# Patient Record
Sex: Female | Born: 1955 | Race: Black or African American | Hispanic: No | Marital: Single
Health system: Southern US, Community
[De-identification: ages and names within clinical notes are randomized; demographics above are authoritative.]

## PROBLEM LIST (undated history)

## (undated) DIAGNOSIS — J449 Chronic obstructive pulmonary disease, unspecified: Secondary | ICD-10-CM

## (undated) DIAGNOSIS — H409 Unspecified glaucoma: Secondary | ICD-10-CM

## (undated) DIAGNOSIS — K219 Gastro-esophageal reflux disease without esophagitis: Secondary | ICD-10-CM

## (undated) DIAGNOSIS — G4733 Obstructive sleep apnea (adult) (pediatric): Secondary | ICD-10-CM

## (undated) DIAGNOSIS — I5030 Unspecified diastolic (congestive) heart failure: Secondary | ICD-10-CM

## (undated) DIAGNOSIS — I471 Supraventricular tachycardia, unspecified: Secondary | ICD-10-CM

## (undated) DIAGNOSIS — E669 Obesity, unspecified: Secondary | ICD-10-CM

## (undated) DIAGNOSIS — I480 Paroxysmal atrial fibrillation: Secondary | ICD-10-CM

## (undated) DIAGNOSIS — N950 Postmenopausal bleeding: Secondary | ICD-10-CM

## (undated) DIAGNOSIS — J9611 Chronic respiratory failure with hypoxia: Secondary | ICD-10-CM

## (undated) DIAGNOSIS — N183 Chronic kidney disease, stage 3 unspecified: Secondary | ICD-10-CM

## (undated) DIAGNOSIS — I272 Pulmonary hypertension, unspecified: Secondary | ICD-10-CM

## (undated) DIAGNOSIS — I10 Essential (primary) hypertension: Secondary | ICD-10-CM

## (undated) DIAGNOSIS — F32A Depression, unspecified: Secondary | ICD-10-CM

## (undated) DIAGNOSIS — R569 Unspecified convulsions: Secondary | ICD-10-CM

## (undated) DIAGNOSIS — D509 Iron deficiency anemia, unspecified: Secondary | ICD-10-CM

## (undated) DIAGNOSIS — M549 Dorsalgia, unspecified: Secondary | ICD-10-CM

## (undated) DIAGNOSIS — E1169 Type 2 diabetes mellitus with other specified complication: Secondary | ICD-10-CM

---

## 2019-11-19 ENCOUNTER — Emergency Department: Payer: Medicare HMO

## 2019-11-19 ENCOUNTER — Inpatient Hospital Stay
Admission: EM | Admit: 2019-11-19 | Discharge: 2019-12-12 | DRG: 871 | Disposition: E | Payer: Medicare HMO | Attending: Internal Medicine | Admitting: Internal Medicine

## 2019-11-19 DIAGNOSIS — R0902 Hypoxemia: Secondary | ICD-10-CM

## 2019-11-19 DIAGNOSIS — Z9981 Dependence on supplemental oxygen: Secondary | ICD-10-CM

## 2019-11-19 DIAGNOSIS — I509 Heart failure, unspecified: Secondary | ICD-10-CM

## 2019-11-19 DIAGNOSIS — N289 Disorder of kidney and ureter, unspecified: Secondary | ICD-10-CM

## 2019-11-19 DIAGNOSIS — I4892 Unspecified atrial flutter: Secondary | ICD-10-CM | POA: Diagnosis present

## 2019-11-19 DIAGNOSIS — Z66 Do not resuscitate: Secondary | ICD-10-CM | POA: Diagnosis not present

## 2019-11-19 DIAGNOSIS — Z20822 Contact with and (suspected) exposure to covid-19: Secondary | ICD-10-CM | POA: Diagnosis present

## 2019-11-19 DIAGNOSIS — I5032 Chronic diastolic (congestive) heart failure: Secondary | ICD-10-CM | POA: Diagnosis not present

## 2019-11-19 DIAGNOSIS — N170 Acute kidney failure with tubular necrosis: Secondary | ICD-10-CM | POA: Diagnosis present

## 2019-11-19 DIAGNOSIS — I480 Paroxysmal atrial fibrillation: Secondary | ICD-10-CM | POA: Diagnosis not present

## 2019-11-19 DIAGNOSIS — I493 Ventricular premature depolarization: Secondary | ICD-10-CM | POA: Diagnosis not present

## 2019-11-19 DIAGNOSIS — N184 Chronic kidney disease, stage 4 (severe): Secondary | ICD-10-CM | POA: Diagnosis present

## 2019-11-19 DIAGNOSIS — E11649 Type 2 diabetes mellitus with hypoglycemia without coma: Secondary | ICD-10-CM | POA: Diagnosis not present

## 2019-11-19 DIAGNOSIS — E861 Hypovolemia: Secondary | ICD-10-CM | POA: Diagnosis present

## 2019-11-19 DIAGNOSIS — J9621 Acute and chronic respiratory failure with hypoxia: Secondary | ICD-10-CM | POA: Diagnosis present

## 2019-11-19 DIAGNOSIS — R6521 Severe sepsis with septic shock: Secondary | ICD-10-CM | POA: Diagnosis present

## 2019-11-19 DIAGNOSIS — G92 Toxic encephalopathy: Secondary | ICD-10-CM | POA: Diagnosis not present

## 2019-11-19 DIAGNOSIS — Z83511 Family history of glaucoma: Secondary | ICD-10-CM

## 2019-11-19 DIAGNOSIS — E875 Hyperkalemia: Secondary | ICD-10-CM | POA: Diagnosis present

## 2019-11-19 DIAGNOSIS — E785 Hyperlipidemia, unspecified: Secondary | ICD-10-CM | POA: Diagnosis present

## 2019-11-19 DIAGNOSIS — R569 Unspecified convulsions: Secondary | ICD-10-CM | POA: Diagnosis not present

## 2019-11-19 DIAGNOSIS — I50813 Acute on chronic right heart failure: Secondary | ICD-10-CM | POA: Diagnosis not present

## 2019-11-19 DIAGNOSIS — I440 Atrioventricular block, first degree: Secondary | ICD-10-CM | POA: Diagnosis present

## 2019-11-19 DIAGNOSIS — I2729 Other secondary pulmonary hypertension: Secondary | ICD-10-CM | POA: Diagnosis present

## 2019-11-19 DIAGNOSIS — I69354 Hemiplegia and hemiparesis following cerebral infarction affecting left non-dominant side: Secondary | ICD-10-CM

## 2019-11-19 DIAGNOSIS — R4189 Other symptoms and signs involving cognitive functions and awareness: Secondary | ICD-10-CM | POA: Diagnosis present

## 2019-11-19 DIAGNOSIS — T502X5A Adverse effect of carbonic-anhydrase inhibitors, benzothiadiazides and other diuretics, initial encounter: Secondary | ICD-10-CM | POA: Diagnosis present

## 2019-11-19 DIAGNOSIS — E872 Acidosis: Secondary | ICD-10-CM | POA: Diagnosis not present

## 2019-11-19 DIAGNOSIS — Z880 Allergy status to penicillin: Secondary | ICD-10-CM

## 2019-11-19 DIAGNOSIS — I639 Cerebral infarction, unspecified: Secondary | ICD-10-CM

## 2019-11-19 DIAGNOSIS — I248 Other forms of acute ischemic heart disease: Secondary | ICD-10-CM | POA: Diagnosis present

## 2019-11-19 DIAGNOSIS — Z515 Encounter for palliative care: Secondary | ICD-10-CM | POA: Diagnosis not present

## 2019-11-19 DIAGNOSIS — I5021 Acute systolic (congestive) heart failure: Secondary | ICD-10-CM | POA: Diagnosis not present

## 2019-11-19 DIAGNOSIS — N189 Chronic kidney disease, unspecified: Secondary | ICD-10-CM | POA: Diagnosis not present

## 2019-11-19 DIAGNOSIS — I469 Cardiac arrest, cause unspecified: Secondary | ICD-10-CM | POA: Diagnosis not present

## 2019-11-19 DIAGNOSIS — I130 Hypertensive heart and chronic kidney disease with heart failure and stage 1 through stage 4 chronic kidney disease, or unspecified chronic kidney disease: Secondary | ICD-10-CM | POA: Diagnosis present

## 2019-11-19 DIAGNOSIS — E662 Morbid (severe) obesity with alveolar hypoventilation: Secondary | ICD-10-CM | POA: Diagnosis present

## 2019-11-19 DIAGNOSIS — K761 Chronic passive congestion of liver: Secondary | ICD-10-CM | POA: Diagnosis present

## 2019-11-19 DIAGNOSIS — N179 Acute kidney failure, unspecified: Secondary | ICD-10-CM | POA: Diagnosis not present

## 2019-11-19 DIAGNOSIS — I5033 Acute on chronic diastolic (congestive) heart failure: Secondary | ICD-10-CM | POA: Diagnosis present

## 2019-11-19 DIAGNOSIS — Z6841 Body Mass Index (BMI) 40.0 and over, adult: Secondary | ICD-10-CM | POA: Diagnosis not present

## 2019-11-19 DIAGNOSIS — Z888 Allergy status to other drugs, medicaments and biological substances status: Secondary | ICD-10-CM

## 2019-11-19 DIAGNOSIS — Z8249 Family history of ischemic heart disease and other diseases of the circulatory system: Secondary | ICD-10-CM

## 2019-11-19 DIAGNOSIS — I959 Hypotension, unspecified: Secondary | ICD-10-CM | POA: Diagnosis present

## 2019-11-19 DIAGNOSIS — J440 Chronic obstructive pulmonary disease with acute lower respiratory infection: Secondary | ICD-10-CM | POA: Diagnosis present

## 2019-11-19 DIAGNOSIS — Z809 Family history of malignant neoplasm, unspecified: Secondary | ICD-10-CM

## 2019-11-19 DIAGNOSIS — E1122 Type 2 diabetes mellitus with diabetic chronic kidney disease: Secondary | ICD-10-CM | POA: Diagnosis present

## 2019-11-19 DIAGNOSIS — K219 Gastro-esophageal reflux disease without esophagitis: Secondary | ICD-10-CM | POA: Diagnosis present

## 2019-11-19 DIAGNOSIS — A419 Sepsis, unspecified organism: Principal | ICD-10-CM | POA: Diagnosis present

## 2019-11-19 DIAGNOSIS — J189 Pneumonia, unspecified organism: Secondary | ICD-10-CM | POA: Diagnosis present

## 2019-11-19 DIAGNOSIS — R7989 Other specified abnormal findings of blood chemistry: Secondary | ICD-10-CM

## 2019-11-19 DIAGNOSIS — I272 Pulmonary hypertension, unspecified: Secondary | ICD-10-CM | POA: Diagnosis not present

## 2019-11-19 DIAGNOSIS — H409 Unspecified glaucoma: Secondary | ICD-10-CM | POA: Diagnosis present

## 2019-11-19 DIAGNOSIS — D631 Anemia in chronic kidney disease: Secondary | ICD-10-CM | POA: Diagnosis present

## 2019-11-19 DIAGNOSIS — J9601 Acute respiratory failure with hypoxia: Secondary | ICD-10-CM | POA: Diagnosis present

## 2019-11-19 DIAGNOSIS — I5082 Biventricular heart failure: Secondary | ICD-10-CM | POA: Diagnosis present

## 2019-11-19 DIAGNOSIS — J9622 Acute and chronic respiratory failure with hypercapnia: Secondary | ICD-10-CM | POA: Diagnosis not present

## 2019-11-19 HISTORY — DX: Iron deficiency anemia, unspecified: D50.9

## 2019-11-19 HISTORY — DX: Chronic obstructive pulmonary disease, unspecified: J44.9

## 2019-11-19 HISTORY — DX: Type 2 diabetes mellitus with other specified complication: E66.9

## 2019-11-19 HISTORY — DX: Obstructive sleep apnea (adult) (pediatric): G47.33

## 2019-11-19 HISTORY — DX: Depression, unspecified: F32.A

## 2019-11-19 HISTORY — DX: Postmenopausal bleeding: N95.0

## 2019-11-19 HISTORY — DX: Gastro-esophageal reflux disease without esophagitis: K21.9

## 2019-11-19 HISTORY — DX: Supraventricular tachycardia: I47.1

## 2019-11-19 HISTORY — DX: Dorsalgia, unspecified: M54.9

## 2019-11-19 HISTORY — DX: Supraventricular tachycardia, unspecified: I47.10

## 2019-11-19 HISTORY — DX: Chronic kidney disease, stage 3 unspecified: N18.30

## 2019-11-19 HISTORY — DX: Chronic respiratory failure with hypoxia: J96.11

## 2019-11-19 HISTORY — DX: Essential (primary) hypertension: I10

## 2019-11-19 HISTORY — DX: Unspecified convulsions: R56.9

## 2019-11-19 HISTORY — DX: Obesity, unspecified: E11.69

## 2019-11-19 HISTORY — DX: Morbid (severe) obesity due to excess calories: E66.01

## 2019-11-19 HISTORY — DX: Unspecified diastolic (congestive) heart failure: I50.30

## 2019-11-19 HISTORY — DX: Paroxysmal atrial fibrillation: I48.0

## 2019-11-19 HISTORY — DX: Pulmonary hypertension, unspecified: I27.20

## 2019-11-19 HISTORY — DX: Unspecified glaucoma: H40.9

## 2019-11-19 LAB — CBC
HCT: 32.5 % — ABNORMAL LOW (ref 36.0–46.0)
Hemoglobin: 9.8 g/dL — ABNORMAL LOW (ref 12.0–15.0)
MCH: 26.5 pg (ref 26.0–34.0)
MCHC: 30.2 g/dL (ref 30.0–36.0)
MCV: 87.8 fL (ref 80.0–100.0)
Platelets: 260 10*3/uL (ref 150–400)
RBC: 3.7 MIL/uL — ABNORMAL LOW (ref 3.87–5.11)
RDW: 18.9 % — ABNORMAL HIGH (ref 11.5–15.5)
WBC: 14 10*3/uL — ABNORMAL HIGH (ref 4.0–10.5)
nRBC: 0 % (ref 0.0–0.2)

## 2019-11-19 LAB — COMPREHENSIVE METABOLIC PANEL
ALT: 29 U/L (ref 0–44)
AST: 55 U/L — ABNORMAL HIGH (ref 15–41)
Albumin: 3.5 g/dL (ref 3.5–5.0)
Alkaline Phosphatase: 291 U/L — ABNORMAL HIGH (ref 38–126)
Anion gap: 29 — ABNORMAL HIGH (ref 5–15)
BUN: 155 mg/dL — ABNORMAL HIGH (ref 8–23)
CO2: 27 mmol/L (ref 22–32)
Calcium: 8.6 mg/dL — ABNORMAL LOW (ref 8.9–10.3)
Chloride: 80 mmol/L — ABNORMAL LOW (ref 98–111)
Creatinine, Ser: 4.79 mg/dL — ABNORMAL HIGH (ref 0.44–1.00)
GFR calc Af Amer: 10 mL/min — ABNORMAL LOW (ref >60)
GFR calc non Af Amer: 9 mL/min — ABNORMAL LOW (ref >60)
Glucose, Bld: 136 mg/dL — ABNORMAL HIGH (ref 70–99)
Potassium: 3.4 mmol/L — ABNORMAL LOW (ref 3.5–5.1)
Sodium: 136 mmol/L (ref 135–145)
Total Bilirubin: 1.5 mg/dL — ABNORMAL HIGH (ref 0.3–1.2)
Total Protein: 8.1 g/dL (ref 6.5–8.1)

## 2019-11-19 LAB — BRAIN NATRIURETIC PEPTIDE: B Natriuretic Peptide: 1712.6 pg/mL — ABNORMAL HIGH (ref 0.0–100.0)

## 2019-11-19 LAB — TROPONIN I (HIGH SENSITIVITY)
Troponin I (High Sensitivity): 82 ng/L — ABNORMAL HIGH (ref <18)
Troponin I (High Sensitivity): 93 ng/L — ABNORMAL HIGH (ref <18)

## 2019-11-19 MED ORDER — POLYETHYLENE GLYCOL 3350 17 G PO PACK: 17.0000 g | PACK | Freq: Every day | ORAL | Status: DC | PRN

## 2019-11-19 MED ORDER — DOCUSATE SODIUM 100 MG PO CAPS: 100.0000 mg | ORAL_CAPSULE | Freq: Two times a day (BID) | ORAL | Status: DC | PRN

## 2019-11-19 MED ORDER — POTASSIUM CHLORIDE 10 MEQ/100ML IV SOLN
10.0000 meq | Freq: Once | INTRAVENOUS | Status: AC
  Administered 2019-11-19: 10 meq via INTRAVENOUS
  Filled 2019-11-19: qty 100

## 2019-11-19 MED ORDER — HEPARIN SODIUM (PORCINE) 5000 UNIT/ML IJ SOLN
5000.0000 [IU] | Freq: Three times a day (TID) | INTRAMUSCULAR | Status: DC
  Administered 2019-11-20: 5000 [IU] via SUBCUTANEOUS
  Filled 2019-11-19 (×2): qty 1

## 2019-11-19 MED ORDER — SODIUM CHLORIDE 0.9 % IV BOLUS: 1000.0000 mL | Freq: Once | INTRAVENOUS | Status: DC

## 2019-11-19 MED ORDER — SODIUM CHLORIDE 0.9 % IV BOLUS
1000.0000 mL | Freq: Once | INTRAVENOUS | Status: AC
  Administered 2019-11-19: 1000 mL via INTRAVENOUS

## 2019-11-19 MED ORDER — SODIUM CHLORIDE 0.9 % IV BOLUS
500.0000 mL | Freq: Once | INTRAVENOUS | Status: AC
  Administered 2019-11-19: 500 mL via INTRAVENOUS

## 2019-11-19 NOTE — H&P (Signed)
Name: Sophia Mendoza MRN: [id number] DOB: [birth year]    ADMISSION DATE:  11/15/2019 CONSULTATION DATE:  11/13/2019  REFERRING MD :  Dr. Kerman Passey  CHIEF COMPLAINT:  Unresponsiveness, hypotension  BRIEF PATIENT DESCRIPTION:  64 y.o. Female admitted with unresponsiveness and questionable seizure activity, hypotension (suspect Hypovolemic due to recent Diuresis), Acute on Chronic Hypoxic Respiratory Failure, and AKI on CKD Stage IV.  Cardiology, Nephrology, and Neurology have been consulted.  SIGNIFICANT EVENTS  8/9: PCCM asked to admit to [facility]; remains in ED due to bed availability 8/10: Witnessed seizure like activity in ED; placed on Keppra 8/10: CT Head with age indeterminate infarcts of right middle temporal gyrus and right parietal lobe, and bilateral cerebellum.  Discussed with Dr. Cheral Marker of Neurology at [facility] who feels infarct is not acute, recommends routine stroke workup  STUDIES:  8/9: CXR>>Cardiomegaly, vascular congestion. No overt edema. No effusions or acute bony abnormality. Aortic atherosclerosis. 8/10: CT Head w/o contrast>>1. Diffuse area of hypoattenuation in the right middle temporal gyrus and right parietal lobe without significant ex vacuo dilation. 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 2. Additional focal areas of cortical hypoattenuation in the high right parietal lobe and bilateral cerebellum are also concerning for infarcts. 3. Atrophy and diffuse white matter changes likely reflect the sequela of chronic microvascular ischemia. 4. Chronic right sphenoid sinus disease. 8/10: EEG>> 8/10: 2D Echocardiogram>>  CULTURES: SARS-CoV-2 PCR 8/9>>  ANTIBIOTICS: N/A  HISTORY OF PRESENT ILLNESS:   Sophia Mendoza is a 64 year old female with a past medical history significant for HFpEF, right-sided CHF, pulmonary hypertension, COPD, on 2 L home O2, hypertension, diabetes mellitus, obesity, anemia who presents to [facility] ED on 11/12/2019 due to  unresponsiveness.  Of note patient was discharged yesterday on 11/18/2019 from [facility] after being treated for CHF exacerbation requiring diuresis with Bumex infusion.  Per notes, patient's family called EMS due to intermittent episodes of unresponsiveness. EMS stated they were able to witness one of the unresponsive episodes, of which they noted mild jerking of her extremities.  Patient was also noted to be hypotensive with systolic blood pressures in the 70s, and to be hypoxic.  Upon arrival to the ED she was noted to be awake and able to answer all questions and follow basic commands.  She exhibited no focal deficits, no paraesthesias, no facial numbness, or no speech/language abnormalities,   She has no known history of seizure disorder.  Initial work-up in the ED revealed BNP 1700, high-sensitivity troponin 82, potassium 3.4, BUN 155, creatinine 4.79, anion gap 29, alkaline phosphatase 291, WBC 14, hemoglobin 9.8.  She was given 1.5 L of IV fluids with noted improvement in blood pressure.  Chest x-ray with cardiomegaly and vascular congestion, but no overt edema and no consolidation noted.  Patient has refused ABG draw and COVID-19 PCR.  PCCM is asked to admit the patient for further work-up and treatment of hypotension, intermittent unresponsiveness with questionable seizure activity, acute hypoxic respiratory failure, and AKI on chronic kidney disease stage IV.  Nephrology, Cardiology, and Neurology are consulted.  PAST MEDICAL HISTORY :   has no past medical history on file.  has no past surgical history on file. Prior to Admission medications   Not on File   Not on File  FAMILY HISTORY:  family history is not on file. SOCIAL HISTORY:     COVID-19 DISASTER DECLARATION:  FULL CONTACT PHYSICAL EXAMINATION WAS NOT POSSIBLE DUE TO TREATMENT OF COVID-19 AND  CONSERVATION OF PERSONAL PROTECTIVE EQUIPMENT, LIMITED EXAM  FINDINGS INCLUDE-  Patient assessed or the symptoms described in the  history of present illness.  In the context of the Global COVID-19 pandemic, which necessitated consideration that the patient might be at risk for infection with the SARS-CoV-2 virus that causes COVID-19, Institutional protocols and algorithms that pertain to the evaluation of patients at risk for COVID-19 are in a state of rapid change based on information released by regulatory bodies including the CDC and federal and state organizations. These policies and algorithms were followed during the patient's care while in hospital.  REVIEW OF SYSTEMS:  Positives in BOLD Constitutional: Negative for fever, chills, weight loss, malaise/fatigue and diaphoresis.  HENT: Negative for hearing loss, ear pain, nosebleeds, congestion, sore throat, neck pain, tinnitus and ear discharge.   Eyes: Negative for blurred vision, double vision, photophobia, pain, discharge and redness.  Respiratory: Negative for +cough, hemoptysis, sputum production, +shortness of breath, wheezing and stridor.   Cardiovascular: Negative for chest pain, palpitations, orthopnea, claudication, leg swelling and PND.  Gastrointestinal: Negative for heartburn, nausea, vomiting, abdominal pain, diarrhea, constipation, blood in stool and melena.  Genitourinary: Negative for dysuria, urgency, frequency, hematuria and flank pain.  Musculoskeletal: Negative for myalgias, back pain, joint pain and falls.  Skin: Negative for itching and rash.  Neurological: Negative for dizziness, tingling, tremors, sensory change, speech change, focal weakness, seizures, loss of consciousness, weakness and headaches.  Endo/Heme/Allergies: Negative for environmental allergies and polydipsia. Does not bruise/bleed easily.  SUBJECTIVE:  Pt reports shortness of breath and nonproductive cough Denies chest pain, abdominal pain, N/V/D, dysuria, fever/chills On NRB mask  VITAL SIGNS: Pulse Rate:  [86-95] 87 (08/09 2142) Resp:  [15-22] 20 (08/09 2143) BP:  (85-115)/(46-76) 115/52 (08/09 2143) SpO2:  [92 %-96 %] 93 % (08/09 2142) Weight:  [409 kg] 124 kg (08/09 1747)  PHYSICAL EXAMINATION: General: Acute on chronically ill-appearing female, laying in bed, on nonrebreather mask, no acute distress Neuro: Sleeping, arouses to voice, follows commands, no focal deficits, speech clear, pupils PERRLA HEENT: Atraumatic, normocephalic, neck supple, no JVD Cardiovascular: Regular rate and rhythm, S1-S2, no murmurs, rubs, gallops Lungs: Clear to auscultation bilaterally, no wheezing or rales, even, nonlabored Abdomen: Obese, soft, nontender, nondistended, no guarding or rebound tenderness, bowel sounds positive x4 Musculoskeletal: Normal bulk and tone, no deformities Skin: Warm and dry.  No obvious rashes, lesions, ulcerations  Recent Labs  Lab 12/11/2019 1743  NA 136  K 3.4*  CL 80*  CO2 27  BUN 155*  CREATININE 4.79*  GLUCOSE 136*   Recent Labs  Lab 11/23/2019 1743  HGB 9.8*  HCT 32.5*  WBC 14.0*  PLT 260   DG Chest Portable 1 View  Result Date: 12/04/2019 CLINICAL DATA:  Shortness of breath EXAM: PORTABLE CHEST 1 VIEW COMPARISON:  None. FINDINGS: Cardiomegaly, vascular congestion. No overt edema. No effusions or acute bony abnormality. Aortic atherosclerosis. IMPRESSION: Cardiomegaly, vascular congestion. Electronically Signed   By: Rolm Baptise M.D.   On: 11/21/2019 18:07    ASSESSMENT / PLAN:  Hypotension, likely hypovolemic due to recent diuresis (discharged yesterday from [facility])  Acute on chronic HFpEF & right-sided CHF Mildly elevated Troponin, suspect Demand Ischemia Hx: Hypertension -Continuous cardiac monitoring -Maintain MAP greater than 65 -Received 1.5 L of IV fluid resuscitation in ED with improvement in blood pressure -Gentle IV hydration -Unable to diurese at this time due to hypotension -Trend BNP -Consult Cardiology, appreciate input -Obtain Echocardiogram   Acute on Chronic Hypoxic Respiratory Failure COPD  without acute Exacerbation Hx: Pulmonary HTN, COPD,  on 6L home O2 -Supplemental O2 as needed to maintain O2 saturations 88 to 92% -BiPAP if needed -Follow intermittent chest x-ray and ABG as needed -As needed bronchodilators   AKI on CKD stage IV -Monitor I&O's / urinary output -Follow BMP -Ensure adequate renal perfusion -Avoid nephrotoxic agents as able -Replace electrolytes as indicated -Consult Nephrology, appreciate input   Leukocytosis, without obvious source of infection -Monitor fever curve -Trend WBC's & Procalcitonin -Follow cultures as above -CXR without evidence of Pneumonia -Urinalysis is pending -Abdominal exam is benign, but obtain RUQ [place] due to elevated Alkaline phosphatase -If procalcitonin is elevated, will start empiric antibiotics   Intermittent Unresponsiveness ? Seizure activity CT Head 11-28-19 with age indeterminate infarcts of the Right middle temporal gyrus and right parietal lob, high right parietal lobe and bilateral cerebellum -Provide supportive care -Obtain CT Head -Obtain EEG -Seizure precautions -Prn Ativan for breakthrough seizures -Place on Keppra -Check urine drug screen -ASA -Echo pending  -Carotid US pending -MRA Head pending -Neurology consulted, appreciate input ~ discussed with Dr. Cheral Marker of Neurology at [facility].  Dr. Cheral Marker reviewed CT scan and doesn't feel this is an acute infarct, and recommends standard stroke workup of Echo, Carotid US, and MRA Head w/o contrast (due to AKI)    Anemia of Chronic Disease -Monitor for S/Sx of bleeding -Trend CBC -SQ Heparin for VTE Prophylaxis  -Transfuse for Hgb <7         BEST PRACTICES DISPOSITION: Stepdown GOALS OF CARE: Full code VTE PROPHYLAXIS: SQ Heparin CONSULTS: Nephrology, Cardiology, Neurology UPDATES: Updated pt at bedside 11/14/2019   Darel Hong, [facility] [place] [facility] Medicine Pager: [phone]  11/24/2019, 10:01 PM

## 2019-11-19 NOTE — ED Notes (Signed)
Paduchowski MD made aware of BP. See orders

## 2019-11-19 NOTE — ED Provider Notes (Addendum)
[facility] Emergency Department Provider Note  Time seen: 5:47 PM  I have reviewed the triage vital signs and the nursing notes.   HISTORY  Chief Complaint Unresponsiveness  HPI Sophia Mendoza is a 64 y.o. female with a past medical history of CHF, hypertension, presents to the emergency department for unresponsiveness.  According to EMS per family and patient she was discharged from [facility] yesterday after admission for CHF and fluid overload.  Patient states she was discharged yesterday, today patient went intermittently unresponsive and EMS was called.  EMS states they were able to witness 1 of these episodes when she had some mild jerking of her extremities and was unresponsive.  However patient's blood pressure per EMS was also extremely low around 70 systolic is the highest they could obtain.  Patient found to be hypoxic, per EMS patient does normally wear 2 L of oxygen at home, requiring nonrebreather to maintain sats greater than 90% per EMS.  Currently 93 to 94% on a nonrebreather in the emergency department.  Upon arrival patient is awake, she will answer questions and follow basic commands.  Patient does appear quite fatigued and somewhat somnolent but awakens to voice and will answer questions.  No history of seizure disorder.   No past medical history on file.  There are no problems to display for this patient.   Prior to Admission medications   Not on File    Not on File  No family history on file.  Social History Social History   Tobacco Use  . Smoking status: Not on file  Substance Use Topics  . Alcohol use: Not on file  . Drug use: Not on file    Review of Systems Constitutional: Negative for fever. Cardiovascular: Negative for chest pain. Respiratory: Negative for shortness of breath.  Mild cough.  Patient states she has not been vaccinated for Covid and will not be getting vaccinated. Gastrointestinal: Negative for abdominal pain,  vomiting  Musculoskeletal: Negative for musculoskeletal complaints Neurological: Negative for headache All other ROS negative  ____________________________________________   PHYSICAL EXAM:  VITAL SIGNS: ED Triage Vitals  Enc Vitals Group     BP 12/11/2019 1747 110/61     Pulse Rate 12/06/2019 1747 94     Resp 12/02/2019 1747 (!) 22     Temp --      Temp src --      SpO2 11/15/2019 1746 93 %     Weight --      Height --      Head Circumference --      Peak Flow --      Pain Score --      Pain Loc --      Pain Edu? --      Excl. in [place]? --    Constitutional: Somnolent/fatigued appearing but will awaken easily to voice and answer questions and follow commands. Eyes: Normal exam ENT      Head: Normocephalic and atraumatic.      Mouth/Throat: Mucous membranes are moist. Cardiovascular: Normal rate, regular rhythm.  Respiratory: Normal respiratory effort without tachypnea nor retractions. Breath sounds are clear Gastrointestinal: Soft and nontender. No distention.   Musculoskeletal: Nontender with normal range of motion in all extremities.  Neurologic:  Normal speech and language. No gross focal neurologic deficits Skin:  Skin is warm, dry and intact.  Psychiatric: Mood and affect are normal.   ____________________________________________    EKG  EKG viewed and interpreted by myself shows a sinus  rhythm 84 bpm with a narrow QRS, somewhat of a right axis deviation, largely normal intervals, nonspecific ST changes.  ____________________________________________    RADIOLOGY  Chest x-ray shows cardiomegaly with vascular congestion but no overt edema.  ____________________________________________   INITIAL IMPRESSION / ASSESSMENT AND PLAN / ED COURSE  Pertinent labs & imaging results that were available during my care of the patient were reviewed by me and considered in my medical decision making (see chart for details).   Patient presents to the emergency department for  episodes of unresponsiveness.  Differential would include syncope versus seizure, patient is hypoxic requiring a nonrebreather to maintain saturations over 90.  Patient is quite hypotensive, despite the patient's history of CHF we will dose IV fluids given her acute hypotension.  Patient is awake and alert but does fall asleep easily is quite somnolent.  Patient's labs show BNP of 1700 with a troponin of 82 but we are unable to see care everywhere labs at this time.  We will attempt to obtain outside records.  Patient's pressure is improving with fluids although remains somewhat tenuous.  I reviewed the patient's records from [facility], patient does appear to be in renal insufficiency compared to her baseline of 3.4, creatinine currently 4.7.  Patient receiving IV fluids for hypotension, appears to be responding to fluids however per [facility] records patient was on Bumex infusion.  Patient will need careful hydration and likely eventual diuresis.  Patient remains in critical condition although more stable than upon arrival.  I spoke to the hospitalist as well as the intensive care provider and they have agreed upon ICU status at this time.  Patient admitted to the hospital for further work-up and treatment.  Troponin is elevated at 87 unsure of troponin baseline.  Repeat reassuringly not significantly changed.  No significant or concerning findings on EKG.  We have been able to wean the patient off her nonrebreather onto nasal cannula.  Hypoxia appears to be improving.   Sophia Mendoza was evaluated in Emergency Department on 11/29/2019 for the symptoms described in the history of present illness. She was evaluated in the context of the global COVID-19 pandemic, which necessitated consideration that the patient might be at risk for infection with the SARS-CoV-2 virus that causes COVID-19. Institutional protocols and algorithms that pertain to the evaluation of patients at risk for COVID-19 are in a state of rapid change based  on information released by regulatory bodies including the CDC and federal and state organizations. These policies and algorithms were followed during the patient's care in the ED.  CRITICAL CARE Performed by: Harvest Dark   Total critical care time: 30 minutes  Critical care time was exclusive of separately billable procedures and treating other patients.  Critical care was necessary to treat or prevent imminent or life-threatening deterioration.  Critical care was time spent personally by me on the following activities: development of treatment plan with patient and/or surrogate as well as nursing, discussions with consultants, evaluation of patient's response to treatment, examination of patient, obtaining history from patient or surrogate, ordering and performing treatments and interventions, ordering and review of laboratory studies, ordering and review of radiographic studies, pulse oximetry and re-evaluation of patient's condition.   ____________________________________________   FINAL CLINICAL IMPRESSION(S) / ED DIAGNOSES  Hypotension Unresponsiveness Hypoxia   Harvest Dark, MD 11/17/2019 2058    Harvest Dark, MD 12/01/2019 [phone]

## 2019-11-19 NOTE — ED Triage Notes (Signed)
Pt presents to ED via [facility] from home. Pt seen 2 days ago at [facility] for CHF and given diurectics. Pt family called out due to pt choking. Upon arrival pt had a focal seizure, aprroximately 82mins. A&Ox4 prior to seizure. and alert after seizure. No hx seizures. Pt uses oxygen at home but was placed on NRB with EMS. 4mg  zofran given in route.     20g RAC 102/66 80-90s HR 33 co2

## 2019-11-19 NOTE — ED Notes (Signed)
Informed MD of BP. No new orders

## 2019-11-19 NOTE — ED Notes (Signed)
Secretary requesting hospital bed for pt

## 2019-11-19 NOTE — ED Notes (Signed)
Sophia Mendoza (son) : [phone]  PT has given permission to keep son updated

## 2019-11-19 NOTE — ED Notes (Addendum)
Have called respiratory for ABG   PT also refusing COVID swab at this time

## 2019-11-19 NOTE — Progress Notes (Signed)
PHARMACY CONSULT NOTE - FOLLOW UP  Pharmacy Consult for Electrolyte Monitoring and Replacement   Recent Labs: Potassium (mmol/L)  Date Value  12/05/2019 3.4 (L)   Calcium (mg/dL)  Date Value  11/27/2019 8.6 (L)   Albumin (g/dL)  Date Value  11/27/2019 3.5   Sodium (mmol/L)  Date Value  12/09/2019 136     Assessment: 8/9 @ 1743:  K = 3.4,  Ca = 8.6, Albumin = 3.5 Corrected Ca = 9.0  CrCl = 14.8 ml/min   Goal of Therapy:  All electroltyes WNL   Plan:  Will order KCl 10 mEq IV X 1 and recheck electrolytes on 8/10 with AM labs.   Orene Desanctis ,PharmD Clinical Pharmacist 11/12/2019 10:06 PM

## 2019-11-19 NOTE — ED Notes (Addendum)
PT states her breathing feels off. Fluids stopped. MD notified.  PT also moved onto hospital bed.

## 2019-11-20 ENCOUNTER — Inpatient Hospital Stay: Payer: Medicare HMO

## 2019-11-20 ENCOUNTER — Encounter: Payer: Self-pay | Admitting: Internal Medicine

## 2019-11-20 ENCOUNTER — Inpatient Hospital Stay (HOSPITAL_COMMUNITY)
Admit: 2019-11-20 | Discharge: 2019-11-20 | Disposition: A | Discharge status: Home / Self Care | Payer: Medicare HMO | Attending: Pulmonary Disease | Admitting: Pulmonary Disease

## 2019-11-20 DIAGNOSIS — I5021 Acute systolic (congestive) heart failure: Secondary | ICD-10-CM

## 2019-11-20 DIAGNOSIS — R4189 Other symptoms and signs involving cognitive functions and awareness: Secondary | ICD-10-CM | POA: Diagnosis not present

## 2019-11-20 DIAGNOSIS — N189 Chronic kidney disease, unspecified: Secondary | ICD-10-CM

## 2019-11-20 DIAGNOSIS — R569 Unspecified convulsions: Secondary | ICD-10-CM | POA: Diagnosis not present

## 2019-11-20 DIAGNOSIS — J9622 Acute and chronic respiratory failure with hypercapnia: Secondary | ICD-10-CM

## 2019-11-20 DIAGNOSIS — I272 Pulmonary hypertension, unspecified: Secondary | ICD-10-CM

## 2019-11-20 DIAGNOSIS — N179 Acute kidney failure, unspecified: Secondary | ICD-10-CM

## 2019-11-20 DIAGNOSIS — I5032 Chronic diastolic (congestive) heart failure: Secondary | ICD-10-CM

## 2019-11-20 DIAGNOSIS — I50813 Acute on chronic right heart failure: Secondary | ICD-10-CM

## 2019-11-20 LAB — CBC
HCT: 32.2 % — ABNORMAL LOW (ref 36.0–46.0)
HCT: 33.1 % — ABNORMAL LOW (ref 36.0–46.0)
Hemoglobin: 9.7 g/dL — ABNORMAL LOW (ref 12.0–15.0)
Hemoglobin: 9.3 g/dL — ABNORMAL LOW (ref 12.0–15.0)
MCH: 27.1 pg (ref 26.0–34.0)
MCH: 26.8 pg (ref 26.0–34.0)
MCHC: 30.1 g/dL (ref 30.0–36.0)
MCHC: 28.1 g/dL — ABNORMAL LOW (ref 30.0–36.0)
MCV: 89.9 fL (ref 80.0–100.0)
MCV: 95.4 fL (ref 80.0–100.0)
Platelets: 242 10*3/uL (ref 150–400)
Platelets: 207 10*3/uL (ref 150–400)
RBC: 3.58 MIL/uL — ABNORMAL LOW (ref 3.87–5.11)
RBC: 3.47 MIL/uL — ABNORMAL LOW (ref 3.87–5.11)
RDW: 19.1 % — ABNORMAL HIGH (ref 11.5–15.5)
RDW: 19.5 % — ABNORMAL HIGH (ref 11.5–15.5)
WBC: 15.3 10*3/uL — ABNORMAL HIGH (ref 4.0–10.5)
WBC: 20.1 10*3/uL — ABNORMAL HIGH (ref 4.0–10.5)
nRBC: 0 % (ref 0.0–0.2)
nRBC: 0 % (ref 0.0–0.2)

## 2019-11-20 LAB — BASIC METABOLIC PANEL
Anion gap: 35 — ABNORMAL HIGH (ref 5–15)
BUN: 156 mg/dL — ABNORMAL HIGH (ref 8–23)
CO2: 15 mmol/L — ABNORMAL LOW (ref 22–32)
Calcium: 9.9 mg/dL (ref 8.9–10.3)
Chloride: 85 mmol/L — ABNORMAL LOW (ref 98–111)
Creatinine, Ser: 5.75 mg/dL — ABNORMAL HIGH (ref 0.44–1.00)
GFR calc Af Amer: 8 mL/min — ABNORMAL LOW (ref >60)
GFR calc non Af Amer: 7 mL/min — ABNORMAL LOW (ref >60)
Glucose, Bld: 199 mg/dL — ABNORMAL HIGH (ref 70–99)
Potassium: 4.5 mmol/L (ref 3.5–5.1)
Sodium: 135 mmol/L (ref 135–145)

## 2019-11-20 LAB — BLOOD GAS, ARTERIAL
Acid-base deficit: 15.9 mmol/L — ABNORMAL HIGH (ref 0.0–2.0)
Acid-base deficit: 17.7 mmol/L — ABNORMAL HIGH (ref 0.0–2.0)
Bicarbonate: 13.6 mmol/L — ABNORMAL LOW (ref 20.0–28.0)
Bicarbonate: 14.1 mmol/L — ABNORMAL LOW (ref 20.0–28.0)
FIO2: 1
FIO2: 1
MECHVT: 450 mL
MECHVT: 450 mL
Mechanical Rate: 18
Mechanical Rate: 18
O2 Saturation: 86.7 %
O2 Saturation: 26.4 %
PEEP: 8 cmH2O
PEEP: 8 cmH2O
Patient temperature: 37
Patient temperature: 37
RATE: 18 resp/min
pCO2 arterial: 47 mmHg (ref 32.0–48.0)
pCO2 arterial: 64 mmHg — ABNORMAL HIGH (ref 32.0–48.0)
pH, Arterial: 7.07 — CL (ref 7.350–7.450)
pH, Arterial: 6.95 — CL (ref 7.350–7.450)
pO2, Arterial: 74 mmHg — ABNORMAL LOW (ref 83.0–108.0)
pO2, Arterial: 31 mmHg — CL (ref 83.0–108.0)

## 2019-11-20 LAB — COMPREHENSIVE METABOLIC PANEL
ALT: 586 U/L — ABNORMAL HIGH (ref 0–44)
AST: 1386 U/L — ABNORMAL HIGH (ref 15–41)
Albumin: 3.4 g/dL — ABNORMAL LOW (ref 3.5–5.0)
Alkaline Phosphatase: 290 U/L — ABNORMAL HIGH (ref 38–126)
Anion gap: 31 — ABNORMAL HIGH (ref 5–15)
BUN: 155 mg/dL — ABNORMAL HIGH (ref 8–23)
CO2: 22 mmol/L (ref 22–32)
Calcium: 8.1 mg/dL — ABNORMAL LOW (ref 8.9–10.3)
Chloride: 83 mmol/L — ABNORMAL LOW (ref 98–111)
Creatinine, Ser: 5.01 mg/dL — ABNORMAL HIGH (ref 0.44–1.00)
GFR calc Af Amer: 10 mL/min — ABNORMAL LOW (ref >60)
GFR calc non Af Amer: 8 mL/min — ABNORMAL LOW (ref >60)
Glucose, Bld: 43 mg/dL — CL (ref 70–99)
Potassium: 4.5 mmol/L (ref 3.5–5.1)
Sodium: 136 mmol/L (ref 135–145)
Total Bilirubin: 2.1 mg/dL — ABNORMAL HIGH (ref 0.3–1.2)
Total Protein: 7.5 g/dL (ref 6.5–8.1)

## 2019-11-20 LAB — COOXEMETRY PANEL
Carboxyhemoglobin: 0.1 % — ABNORMAL LOW (ref 0.5–1.5)
Methemoglobin: 1.3 % (ref 0.0–1.5)
O2 Saturation: 26.4 %
Total oxygen content: 28.4 mL/dL

## 2019-11-20 LAB — GLUCOSE, CAPILLARY
Glucose-Capillary: 128 mg/dL — ABNORMAL HIGH (ref 70–99)
Glucose-Capillary: 26 mg/dL — CL (ref 70–99)
Glucose-Capillary: 97 mg/dL (ref 70–99)
Glucose-Capillary: 77 mg/dL (ref 70–99)
Glucose-Capillary: 39 mg/dL — CL (ref 70–99)
Glucose-Capillary: 113 mg/dL — ABNORMAL HIGH (ref 70–99)
Glucose-Capillary: 110 mg/dL — ABNORMAL HIGH (ref 70–99)
Glucose-Capillary: 61 mg/dL — ABNORMAL LOW (ref 70–99)

## 2019-11-20 LAB — LACTIC ACID, PLASMA
Lactic Acid, Venous: 9.7 mmol/L (ref 0.5–1.9)
Lactic Acid, Venous: >11 mmol/L (ref 0.5–1.9)

## 2019-11-20 LAB — SARS CORONAVIRUS 2 BY RT PCR (HOSPITAL ORDER, PERFORMED IN ~~LOC~~ HOSPITAL LAB): SARS Coronavirus 2: NEGATIVE

## 2019-11-20 LAB — BRAIN NATRIURETIC PEPTIDE: B Natriuretic Peptide: 850.9 pg/mL — ABNORMAL HIGH (ref 0.0–100.0)

## 2019-11-20 LAB — AMMONIA: Ammonia: 59 umol/L — ABNORMAL HIGH (ref 9–35)

## 2019-11-20 LAB — TROPONIN I (HIGH SENSITIVITY): Troponin I (High Sensitivity): 906 ng/L (ref <18)

## 2019-11-20 LAB — HIV ANTIBODY (ROUTINE TESTING W REFLEX): HIV Screen 4th Generation wRfx: NONREACTIVE

## 2019-11-20 LAB — MAGNESIUM: Magnesium: 2.9 mg/dL — ABNORMAL HIGH (ref 1.7–2.4)

## 2019-11-20 LAB — PROCALCITONIN: Procalcitonin: 1.02 ng/mL

## 2019-11-20 LAB — ECHOCARDIOGRAM COMPLETE
Height: 63 in
Weight: 4373.93 oz

## 2019-11-20 MED ORDER — SODIUM BICARBONATE 8.4 % IV SOLN
50.0000 meq | Freq: Once | INTRAVENOUS | Status: AC
  Administered 2019-11-20: 50 meq via INTRAVENOUS

## 2019-11-20 MED ORDER — DEXTROSE 50 % IV SOLN
INTRAVENOUS | Status: AC
  Administered 2019-11-20: 50 mL via INTRAVENOUS
  Filled 2019-11-20: qty 50

## 2019-11-20 MED ORDER — VASOPRESSIN 20 UNITS/100 ML INFUSION FOR SHOCK
0.0000 [IU]/min | INTRAVENOUS | Status: DC
  Administered 2019-11-20: 0.06 [IU]/min via INTRAVENOUS
  Filled 2019-11-20: qty 100

## 2019-11-20 MED ORDER — NOREPINEPHRINE 4 MG/250ML-% IV SOLN
INTRAVENOUS | Status: AC
  Administered 2019-11-20: 50 ug/min via INTRAVENOUS
  Filled 2019-11-20: qty 250

## 2019-11-20 MED ORDER — SODIUM BICARBONATE 8.4 % IV SOLN: 50.0000 meq | Freq: Once | INTRAVENOUS | Status: DC

## 2019-11-20 MED ORDER — INSULIN ASPART 100 UNIT/ML ~~LOC~~ SOLN: 0.0000 [IU] | Freq: Every day | SUBCUTANEOUS | Status: DC

## 2019-11-20 MED ORDER — ALBUTEROL SULFATE (2.5 MG/3ML) 0.083% IN NEBU
15.0000 mg | INHALATION_SOLUTION | Freq: Once | RESPIRATORY_TRACT | Status: AC
  Administered 2019-11-20: 15 mg via RESPIRATORY_TRACT
  Filled 2019-11-20: qty 18

## 2019-11-20 MED ORDER — MORPHINE SULFATE (PF) 4 MG/ML IV SOLN
10.0000 mg | Freq: Once | INTRAVENOUS | Status: AC
  Administered 2019-11-20: 10 mg via INTRAVENOUS
  Filled 2019-11-20: qty 3

## 2019-11-20 MED ORDER — LORAZEPAM 2 MG/ML IJ SOLN: 1.0000 mg | INTRAMUSCULAR | Status: DC | PRN

## 2019-11-20 MED ORDER — LORAZEPAM 2 MG/ML IJ SOLN: 2.0000 mg | INTRAMUSCULAR | Status: DC | PRN

## 2019-11-20 MED ORDER — DEXTROSE-NACL 5-0.9 % IV SOLN: INTRAVENOUS | Status: DC

## 2019-11-20 MED ORDER — LEVETIRACETAM IN NACL 500 MG/100ML IV SOLN
500.0000 mg | Freq: Two times a day (BID) | INTRAVENOUS | Status: DC
  Administered 2019-11-20 (×2): 500 mg via INTRAVENOUS
  Filled 2019-11-20 (×3): qty 100

## 2019-11-20 MED ORDER — ATROPINE SULFATE 1 MG/10ML IJ SOSY
1.0000 mg | PREFILLED_SYRINGE | Freq: Once | INTRAMUSCULAR | Status: AC
  Administered 2019-11-20: 1 mg via INTRAVENOUS

## 2019-11-20 MED ORDER — FENTANYL 2500MCG IN NS 250ML (10MCG/ML) PREMIX INFUSION
0.0000 ug/h | INTRAVENOUS | Status: DC
  Administered 2019-11-20: 50 ug/h via INTRAVENOUS

## 2019-11-20 MED ORDER — INSULIN ASPART 100 UNIT/ML ~~LOC~~ SOLN
SUBCUTANEOUS | Status: AC
  Filled 2019-11-20: qty 1

## 2019-11-20 MED ORDER — FUROSEMIDE 10 MG/ML IJ SOLN
80.0000 mg | Freq: Once | INTRAMUSCULAR | Status: AC
  Administered 2019-11-20: 80 mg via INTRAVENOUS

## 2019-11-20 MED ORDER — INSULIN ASPART 100 UNIT/ML ~~LOC~~ SOLN: 0.0000 [IU] | Freq: Three times a day (TID) | SUBCUTANEOUS | Status: DC

## 2019-11-20 MED ORDER — ASPIRIN EC 81 MG PO TBEC: 81.0000 mg | DELAYED_RELEASE_TABLET | Freq: Every day | ORAL | Status: DC

## 2019-11-20 MED ORDER — VANCOMYCIN HCL 1750 MG/350ML IV SOLN
1750.0000 mg | Freq: Once | INTRAVENOUS | Status: AC
  Administered 2019-11-20: 1750 mg via INTRAVENOUS
  Filled 2019-11-20: qty 350

## 2019-11-20 MED ORDER — NOREPINEPHRINE 4 MG/250ML-% IV SOLN: 10.0000 ug/min | INTRAVENOUS | Status: DC

## 2019-11-20 MED ORDER — VANCOMYCIN VARIABLE DOSE PER UNSTABLE RENAL FUNCTION (PHARMACIST DOSING): Status: DC

## 2019-11-20 MED ORDER — DEXTROSE 50 % IV SOLN
INTRAVENOUS | Status: AC
  Filled 2019-11-20: qty 50

## 2019-11-20 MED ORDER — EPINEPHRINE 1 MG/10ML IJ SOSY
1.0000 mg | PREFILLED_SYRINGE | Freq: Once | INTRAMUSCULAR | Status: AC
  Administered 2019-11-20: 1 mg via INTRAVENOUS

## 2019-11-20 MED ORDER — FAMOTIDINE IN NACL 20-0.9 MG/50ML-% IV SOLN: 20.0000 mg | INTRAVENOUS | Status: DC

## 2019-11-20 MED ORDER — ASPIRIN EC 325 MG PO TBEC
325.0000 mg | DELAYED_RELEASE_TABLET | Freq: Every day | ORAL | Status: DC
  Administered 2019-11-20: 325 mg via ORAL
  Filled 2019-11-20: qty 1

## 2019-11-20 MED ORDER — DOBUTAMINE IN D5W 4-5 MG/ML-% IV SOLN
5.0000 ug/kg/min | INTRAVENOUS | Status: DC
  Administered 2019-11-20: 2 ug/kg/min via INTRAVENOUS
  Filled 2019-11-20: qty 250

## 2019-11-20 MED ORDER — INSULIN ASPART 100 UNIT/ML IV SOLN
10.0000 [IU] | Freq: Once | INTRAVENOUS | Status: AC
  Administered 2019-11-20: 10 [IU] via INTRAVENOUS
  Filled 2019-11-20: qty 0.1

## 2019-11-20 MED ORDER — FENTANYL 2500MCG IN NS 250ML (10MCG/ML) PREMIX INFUSION
INTRAVENOUS | Status: AC
  Filled 2019-11-20: qty 250

## 2019-11-20 MED ORDER — CALCIUM GLUCONATE-NACL 1-0.675 GM/50ML-% IV SOLN
1.0000 g | Freq: Once | INTRAVENOUS | Status: AC
  Administered 2019-11-20: 1000 mg via INTRAVENOUS
  Filled 2019-11-20 (×2): qty 50

## 2019-11-20 MED ORDER — CALCIUM CHLORIDE 10 % IV SOLN
INTRAVENOUS | Status: AC
  Administered 2019-11-20: 1 g via INTRAVENOUS
  Filled 2019-11-20: qty 10

## 2019-11-20 MED ORDER — LORAZEPAM 2 MG/ML IJ SOLN
4.0000 mg | Freq: Once | INTRAMUSCULAR | Status: AC
  Administered 2019-11-20: 4 mg via INTRAVENOUS
  Filled 2019-11-20: qty 2

## 2019-11-20 MED ORDER — CALCIUM GLUCONATE 10 % IV SOLN
INTRAVENOUS | Status: AC
  Filled 2019-11-20: qty 10

## 2019-11-20 MED ORDER — ALBUMIN HUMAN 25 % IV SOLN
25.0000 g | Freq: Once | INTRAVENOUS | Status: DC
  Filled 2019-11-20: qty 100

## 2019-11-20 MED ORDER — SUCCINYLCHOLINE CHLORIDE 20 MG/ML IJ SOLN
150.0000 mg | Freq: Once | INTRAMUSCULAR | Status: AC
  Administered 2019-11-20: 150 mg via INTRAVENOUS

## 2019-11-20 MED ORDER — ETOMIDATE 2 MG/ML IV SOLN
20.0000 mg | Freq: Once | INTRAVENOUS | Status: AC
  Administered 2019-11-20: 20 mg via INTRAVENOUS

## 2019-11-20 MED ORDER — STERILE WATER FOR INJECTION IV SOLN
INTRAVENOUS | Status: DC
  Filled 2019-11-20: qty 850

## 2019-11-20 MED ORDER — DEXTROSE 50 % IV SOLN: 1.0000 | Freq: Once | INTRAVENOUS | Status: AC

## 2019-11-20 MED ORDER — DEXTROSE 50 % IV SOLN
25.0000 g | Freq: Once | INTRAVENOUS | Status: AC
  Administered 2019-11-20: 25 g via INTRAVENOUS

## 2019-11-20 MED ORDER — SODIUM CHLORIDE 0.9 % IV BOLUS
250.0000 mL | Freq: Once | INTRAVENOUS | Status: AC
  Administered 2019-11-20: 250 mL via INTRAVENOUS

## 2019-11-20 MED ORDER — CALCIUM CHLORIDE 10 % IV SOLN
1.0000 g | Freq: Once | INTRAVENOUS | Status: AC
  Administered 2019-11-20: 1 g via INTRAVENOUS

## 2019-11-20 MED ORDER — CALCIUM CHLORIDE 10 % IV SOLN: 1.0000 g | Freq: Once | INTRAVENOUS | Status: AC

## 2019-11-20 MED ORDER — PIPERACILLIN-TAZOBACTAM 3.375 G IVPB
3.3750 g | Freq: Two times a day (BID) | INTRAVENOUS | Status: DC
  Administered 2019-11-20: 3.375 g via INTRAVENOUS
  Filled 2019-11-20: qty 50

## 2019-11-21 LAB — BLOOD CULTURE ID PANEL (REFLEXED) - BCID2

## 2019-11-21 LAB — BLOOD GAS, ARTERIAL
Acid-base deficit: 7.2 mmol/L — ABNORMAL HIGH (ref 0.0–2.0)
Bicarbonate: 19.7 mmol/L — ABNORMAL LOW (ref 20.0–28.0)
Delivery systems: POSITIVE
Expiratory PAP: 8
FIO2: 0.8
Inspiratory PAP: 14
O2 Saturation: 94.5 %
Patient temperature: 37
pCO2 arterial: 44 mmHg (ref 32.0–48.0)
pH, Arterial: 7.26 — ABNORMAL LOW (ref 7.350–7.450)
pO2, Arterial: 84 mmHg (ref 83.0–108.0)

## 2019-11-21 MED FILL — Medication: Qty: 1 | Status: AC

## 2019-11-22 LAB — HEMOGLOBIN A1C
Hgb A1c MFr Bld: 7.6 % — ABNORMAL HIGH (ref 4.8–5.6)
Mean Plasma Glucose: 171 mg/dL

## 2019-11-23 LAB — CULTURE, BLOOD (ROUTINE X 2): Special Requests: ADEQUATE

## 2019-11-23 LAB — URINE CULTURE: Culture: >=100000 — AB

## 2019-11-24 MED FILL — Vasopressin IV Soln 20 Unit/ML (For IV Infusion): INTRAVENOUS | Qty: 1 | Status: AC

## 2019-11-24 MED FILL — Sodium Chloride IV Soln 0.9%: INTRAVENOUS | Qty: 100 | Status: AC

## 2019-12-12 NOTE — ED Notes (Signed)
No urine in foley bag.  Iv meds infusing.  Family with pt.  Blood pressure 78/50

## 2019-12-12 NOTE — Discharge Summary (Signed)
Physician Discharge Summary  Patient ID: Sophia Mendoza MRN: [id number] DOB/AGE: Sep 03, 1955 64 y.o.  Admit date: 11/28/2019 Discharge date: November 24, 2019    Discharge Diagnoses:  Acute on chronic respiratory failure Toxic metabolic encephalopathy Pneumonia Septic shock AKI on CKD Uncontrolled diabetes mellitus with episodes of hypoglycemia Anemia                                                                            DISCHARGE SUMMARY   Sophia Mendoza is a 64 y.o. y/o female with a PMH of HFpEF, chronic heart failure complicated by severe pulmonary hypertension, CKD stage IV, atrial fibrillation/flutter, chronic hypoxic respiratory failure, type 2 diabetes mellitus, obstructive sleep apnea, morbid obesity with obesity hypoventilation syndrome, hyperlipidemia, chronic anemia.  Recent admission to [facility] with congestive heart failure. During admission patient continued to have worsening mental status, initially she was placed on a BiPAP and had to be intubated.  Patient suffered a cardiac arrest while in the ED resuscitation was provided by ED provider.  She was evaluated by nephrology was plan to start on RRT.  In addition she was evaluated by cardiology, started on dobutamine and no recommendation for invasive ischemic work-up. Family decided to change CODE STATUS to DNR, with little support and proceed with comfort care. Patient expired on Nov 24, 2019 at 2054.                   Discharge Exam: Patient expired   Vitals:   24-Nov-2019 1934 November 24, 2019 1944 Nov 24, 2019 1954 11-24-2019 2004  BP: (!) 78/47     Pulse:      Resp: 15 (!) 30 (!) 23 (!) 25  SpO2:      Weight:      Height:         Discharge Labs  BMET Recent Labs  Lab 12/02/2019 1743 11/28/2019 1743 2019-11-24 0507 2019/11/24 1738  NA 136  --  136 135  K 3.4*   < > 4.5 4.5  CL 80*  --  83* 85*  CO2 27  --  22 15*  GLUCOSE 136*  --  43* 199*  BUN 155*  --  155* 156*  CREATININE 4.79*  --  5.01* 5.75*  CALCIUM 8.6*  --  8.1* 9.9   MG  --   --   --  2.9*   < > = values in this interval not displayed.    CBC Recent Labs  Lab 11/13/2019 1743 11/24/19 0507 Nov 24, 2019 1738  HGB 9.8* 9.7* 9.3*  HCT 32.5* 32.2* 33.1*  WBC 14.0* 15.3* 20.1*  PLT 260 242 207    Anti-Coagulation No results for input(s): INR in the last 168 hours.          Disposition:  Patient expired on 09/20/2019 at 2054  Discharge summary less < 30 minutes

## 2019-12-12 NOTE — Consult Note (Addendum)
Neurology Consultation  Reason for Consult: Altered mental status, seizure Referring Physician: Dr. Soyla Murphy, critical care  CC: Altered mental status, seizure  History is obtained from: Chart, patient, son  HPI: Sophia Mendoza is a 64 y.o. female past medical history significant for congestive heart failure, CKD 4, COPD, diabetes, hypertension, atrial flutter, paroxysmal atrial fibrillation-at some point was anticoagulated with Xarelto and later on Eliquis but had bleeds on both and refused anticoagulants per [facility] charts,, supraventricular tachycardia, obesity, anemia, prior stroke visible on the CT scan-family unclear whether she had a stroke in the past or not but the patient reports stroke with left-sided weakness residual, presenting to the emergency room for episodes of intermittent drowsiness and unresponsiveness as well as witnessed episode of seizure-like activity. According to the history provided, she was recently discharged from [facility] after admission for CHF and fluid overload, and went home and kept becoming intermittently unresponsiveness with return to normal mentation.  EMS was called and they were able to witness 1 of these episodes.  They reported some jerking of her extremities and unresponsiveness.  Her pressures at the time were extremely low around systolic 70.  She was also found to be severely hypoxic at the time. She wears home oxygen at 2 L but required nonrebreather to maintain normal saturations. She was saturating 93 to 94% in the ED.  Was noted to be in CHF exacerbation versus pneumonia and admitted to critical care. Due to the history of shaking/concerning for seizures, neurology was consulted.  I was able to speak with the son-Mario Murakami over the phone, who is the primary caregiver.  He told me that he never knew that his mother had a stroke although he does now on recollection remember that she had some episodes concerning for seizures-1 in 2017 and once after that,  but she was never on any antiepileptics. He also reports that she used to get into severe coughing fits and used to pass out because of severe cough.  Her main concerns at this point over the last few months have been her poor CHF control requiring multiple doctor visits or hospitalizations.  LKW: Unclear tpa given?: no, unclear as well Premorbid modified Rankin scale (mRS): 3-mostly because of CHF and COPD  ROS: Unable to reliably ascertain due to patient's mentation  Past medical history: CHF Hypertension Diabetes Paroxysmal atrial fibrillation Atrial flutter Supraventricular tachycardia Anemia Obesity  No family history on file. Patient unable to provide  Social History:   has no history on file for tobacco use, alcohol use, and drug use. Patient unable to provide  Medications  Current Facility-Administered Medications:    aspirin EC tablet 325 mg, 325 mg, Oral, Daily, Darel Hong D, NP, 325 mg at 12-08-19 0508   dextrose 5 %-0.9 % sodium chloride infusion, , Intravenous, Continuous, Darel Hong D, NP, Last Rate: 50 mL/hr at Dec 08, 2019 0633, New Bag at 12-08-19 0633   docusate sodium (COLACE) capsule 100 mg, 100 mg, Oral, BID PRN, Darel Hong D, NP   heparin injection 5,000 Units, 5,000 Units, Subcutaneous, Q8H, Darel Hong D, NP   insulin aspart (novoLOG) injection 0-15 Units, 0-15 Units, Subcutaneous, TID WC, Darel Hong D, NP   insulin aspart (novoLOG) injection 0-5 Units, 0-5 Units, Subcutaneous, QHS, Darel Hong D, NP   levETIRAcetam (KEPPRA) IVPB 500 mg/100 mL premix, 500 mg, Intravenous, Q12H, Bradly Bienenstock, NP, Stopped at 12/08/19 0607   LORazepam (ATIVAN) injection 2 mg, 2 mg, Intravenous, Q4H PRN, Bradly Bienenstock, NP  piperacillin-tazobactam (ZOSYN) IVPB 3.375 g, 3.375 g, Intravenous, Q12H, Samaan, Maged, MD   polyethylene glycol (MIRALAX / GLYCOLAX) packet 17 g, 17 g, Oral, Daily PRN, Darel Hong D, NP   vancomycin  variable dose per unstable renal function (pharmacist dosing), , Does not apply, See admin instructions, Cassandria Santee, MD  Current Outpatient Medications:    albuterol (VENTOLIN HFA) 108 (90 Base) MCG/ACT inhaler, Inhale 2 puffs into the lungs every 4 (four) hours as needed for wheezing or shortness of breath., Disp: , Rfl:    allopurinol (ZYLOPRIM) 100 MG tablet, Take 100 mg by mouth daily., Disp: , Rfl:    ascorbic acid (VITAMIN C) 1000 MG tablet, Take 1,000 mg by mouth 2 (two) times daily., Disp: , Rfl:    atorvastatin (LIPITOR) 80 MG tablet, Take 80 mg by mouth daily., Disp: , Rfl:    bumetanide (BUMEX) 2 MG tablet, Take 6 mg by mouth 2 (two) times daily. Take an additional 6 mg if your weight increases by 3 lbs in 1 day or 5 lbs in 3 days., Disp: , Rfl:    cetirizine (ZYRTEC) 10 MG tablet, Take 10 mg by mouth daily., Disp: , Rfl:    Cholecalciferol 25 MCG (1000 UT) capsule, Take 1,000 Units by mouth daily., Disp: , Rfl:    fluticasone (FLONASE) 50 MCG/ACT nasal spray, Place 1 spray into both nostrils daily., Disp: , Rfl:    Fluticasone-Salmeterol (ADVAIR) 250-50 MCG/DOSE AEPB, Inhale 1 puff into the lungs 2 (two) times daily., Disp: , Rfl:    insulin aspart (NOVOLOG) 100 UNIT/ML FlexPen, Inject 12 Units into the skin 2 (two) times daily., Disp: , Rfl:    insulin glargine (LANTUS) 100 UNIT/ML Solostar Pen, Inject 30 Units into the skin daily., Disp: , Rfl:    JARDIANCE 10 MG TABS tablet, Take 10 mg by mouth daily., Disp: , Rfl:    metolazone (ZAROXOLYN) 5 MG tablet, Take 5 mg by mouth 2 (two) times daily., Disp: , Rfl:    metoprolol succinate (TOPROL-XL) 25 MG 24 hr tablet, Take 25 mg by mouth 2 (two) times daily., Disp: , Rfl:    omeprazole (PRILOSEC) 20 MG capsule, Take 20 mg by mouth in the morning., Disp: , Rfl:    Travoprost, BAK Free, (TRAVATAN) 0.004 % SOLN ophthalmic solution, Place 1 drop into the left eye 2 (two) times daily., Disp: , Rfl:    umeclidinium bromide  (INCRUSE ELLIPTA) 62.5 MCG/INH AEPB, Inhale 1 puff into the lungs daily., Disp: , Rfl:    triamcinolone ointment (KENALOG) 0.1 %, Apply 1 application topically 2 (two) times daily., Disp: , Rfl:   Exam: Current vital signs: BP (!) 81/52 (BP Location: Left Arm)    Pulse 80    Resp 16    Ht 5\' 3"  (1.6 m)    Wt 124 kg    SpO2 100%    BMI 48.43 kg/m  Vital signs in last 24 hours: Pulse Rate:  [80-95] 80 (08/10 0857) Resp:  [14-22] 16 (08/10 0857) BP: (66-115)/(46-76) 81/52 (08/10 0857) SpO2:  [92 %-100 %] 100 % (08/10 0857) FiO2 (%):  [80 %] 80 % (08/10 0245) Weight:  [378 kg] 124 kg (08/09 1747) General: Patient is sleepy, opens eyes to voice and follows commands.  Keeps falling asleep multiple times during the encounter. HEENT: Normocephalic atraumatic currently on a BiPAP CVS: Regular rate rhythm Respiratory: On BiPAP Extremities: 2+ pitting edema all over Abdomen: Obese Neurological exam She is sleepy, opens eyes to voice and follows  commands but keeps falling asleep with poor attention concentration. Given her age of 57 years when she is 64 years old.  Was able to tell her age correctly.  Knows she is in the hospital and kept saying she wants to get out. Her speech is dysarthric Cranial nerves: Pupils equal round react to light, no gaze deviation or preference, left lower facial weakness-reports that his baseline, tongue and palate midline. Motor exam: Antigravity right upper extremity with mild asterixis-4+/5 strength.  Antigravity 4 -/5 strength in the left upper extremity.  Antigravity 4+/5 right lower extremity with no drift and mild asterixis.  Did not move the left leg as well as right leg-was barely antigravity at 3/5.  Unclear if that is new or attention related. Sensory exam: Intact to light touch all over Coordination: Difficult to perform given her profound inattention Unable to elicit any DTRs 1a Level of Conscious.: 1 1b LOC Questions: 2 1c LOC Commands: 0 2 Best Gaze:  0 3 Visual: 0 4 Facial Palsy: 1 5a Motor Arm - left: 0 5b Motor Arm - Right: 0 6a Motor Leg - Left: 2 6b Motor Leg - Right: 0 7 Limb Ataxia: 0 8 Sensory: 0 9 Best Language: 0 10 Dysarthria: 1 11 Extinct. and Inatten.: 0 TOTAL: 7  Labs I have reviewed labs in epic and the results pertinent to this consultation are:  CBC    Component Value Date/Time   WBC 15.3 (H) Nov 22, 2019 0507   RBC 3.58 (L) 11/22/19 0507   HGB 9.7 (L) 11-22-2019 0507   HCT 32.2 (L) 2019-11-22 0507   PLT 242 Nov 22, 2019 0507   MCV 89.9 2019/11/22 0507   MCH 27.1 11/22/2019 0507   MCHC 30.1 11/22/2019 0507   RDW 19.1 (H) 11/22/2019 0507    CMP     Component Value Date/Time   NA 136 November 22, 2019 0507   K 4.5 2019-11-22 0507   CL 83 (L) 2019/11/22 0507   CO2 22 November 22, 2019 0507   GLUCOSE 43 (LL) 11-22-19 0507   BUN 155 (H) 11/22/19 0507   CREATININE 5.01 (H) 2019/11/22 0507   CALCIUM 8.1 (L) November 22, 2019 0507   PROT 7.5 22-Nov-2019 0507   ALBUMIN 3.4 (L) 11-22-2019 0507   AST 1,386 (H) 11/22/19 0507   ALT 586 (H) Nov 22, 2019 0507   ALKPHOS 290 (H) November 22, 2019 0507   BILITOT 2.1 (H) 2019-11-22 0507   GFRNONAA 8 (L) November 22, 2019 0507   GFRAA 10 (L) 11/22/19 0507    Imaging I have reviewed the images obtained:  CT-scan of the brain-chronic right temporal encephalomalacia.  No acute changes. CT head and neck report from 2016 at [place] reports chronic right MCA territory infarct.  Assessment:  64 year old woman with past medical history significant for CHF, CKD 4, COPD, diabetes, hypertension, atrial flutter, paroxysmal A. fib not on anticoagulation due to bleeds, supraventricular tachycardia, obesity, anemia, prior stroke with possible residual left-sided weakness, presented to the emergency room for evaluation of intermittent episodes of unresponsiveness as well as concern for jerking movements concerning for seizures. Family reports she has probably had a seizure once or twice in the  past 4 to 5 years. On examination today, she is very sleepy, and has poor attention concentration because she keeps falling asleep during this exam.  It seems that this is likely related to metabolic conditions such as CO2 narcosis. Irrespective, she does have some left-sided weakness evident on the exam even though it is a limited exam. Given the prior history of right-sided stroke  and structural lesion-encephalomalacia-I would recommend starting her on an antiepileptic at this time while working on her medical management for her CHF, COPD and CKD etc.  Impression: Toxic metabolic encephalopathy-multifactorial including uremia, CO2 retention, possible hyperammonemia Possible underlying seizures History of stroke with encephalomalacia in the right MCA territory History of atrial fibrillation-not on anticoagulation by choice   Recommendations:  From a stroke prevention standpoint-she should be on anticoagulation but review of chart reveals that she has refused anticoagulation after having tried Xarelto and Eliquis and having had bleeds on both.  This is in the [facility] healthcare charts. I would continue discussions with her as she gets better from a general medical standpoint about consideration for anticoagulation.  For the possible seizures: Family reports more than 1 seizure, and given the structural lesion (prior right MCA infarct on CT), start her on anti epileptics at this time.  Keppra 500 twice daily should be used. Maintain seizure precautions.EEG at some point today.  I would like to image her brain further-both to look at the structure with an MRI of the brain without contrast and also to look at the vasculature with an MRA of the head without contrast and carotid Dopplers for the neck vasculature (cannot do CTA due to deranged renal function) but she is too unstable to go into the MRI for right now due to being on BiPAP.  Imaging can be performed as she improves.  For the toxic metabolic  encephalopathy: Continue to manage per primary team as you are.  Her exam shows encephalopathy as well as some asterixis, which could all be due to CO2 retention as well as uremia as well as hepatic dysfunction.  Would recommend checking ammonia levels as well -if elevated, treat with lactulose.  -- Amie Portland, MD [facility] Pager: [phone] If 7pm to 7am, please call on call as listed on [facility]ION.  CRITICAL CARE ATTESTATION Performed by: Amie Portland, MD Total critical care time: 55 minutes Critical care time was exclusive of separately billable procedures and treating other patients and/or supervising APPs/Residents/Students Critical care was necessary to treat or prevent imminent or life-threatening deterioration due to seizures, toxic metabolic encephalopathy, carbon oxide narcosis This patient is critically ill and at significant risk for neurological worsening and/or death and care requires constant monitoring. Critical care was time spent personally by me on the following activities: development of treatment plan with patient and/or surrogate as well as nursing, discussions with consultants, evaluation of patient's response to treatment, examination of patient, obtaining history from patient or surrogate, ordering and performing treatments and interventions, ordering and review of laboratory studies, ordering and review of radiographic studies, pulse oximetry, re-evaluation of patient's condition, participation in multidisciplinary rounds and medical decision making of high complexity in the care of this patient.

## 2019-12-12 NOTE — ED Notes (Signed)
Pt passed at 2054.  Family at bedside with chaplin.   md aware.

## 2019-12-12 NOTE — ED Notes (Signed)
Md, rt and rn at bedside,pt with sinus brady 38-45 on monitor.  Pt intubated after meds given for intubation. Repeat cxr done

## 2019-12-12 NOTE — Consult Note (Signed)
[facility]  CONSULT NOTE    Date: 12/05/2019                  Patient Name:  Sophia Mendoza  MRN: [id number]  DOB: 1955-12-17  Age / Sex: 64 y.o., female         PCP: System, Pcp Not In                 Service Requesting Consult: Dr. Soyla Murphy                 Reason for Consult: Acute renal failure            History of Present Illness: Ms. Sophia Mendoza was admitted to [facility] from 7/18 to 8/7 for acute exacerbation of congestive heart failure. She was evaluated by nephrology at [facility] where she was determined to have chronic kidney disease stage IV and did not require renal replacement therapy at that time.   Patient presents to [facility] on 11/18/2019 for unresponsiveness which is concerning for seizures.   Nephrology has been consulted for acute renal failure with metabolic acidosis.    Medications: Outpatient medications: (Not in a hospital admission)   Current medications: Current Facility-Administered Medications  Medication Dose Route Frequency Provider Last Rate Last Admin  . albumin human 25 % solution 25 g  25 g Intravenous Once Samaan, Maged, MD      . aspirin EC tablet 325 mg  325 mg Oral Daily Darel Hong D, NP   325 mg at 12/05/19 1962  . dextrose 5 %-0.9 % sodium chloride infusion   Intravenous Continuous Bradly Bienenstock, NP 50 mL/hr at 12-05-2019 2297 New Bag at 2019-12-05 9892  . dextrose 50 % solution           . DOBUTamine (DOBUTREX) infusion 4000 mcg/mL  2 mcg/kg/min Intravenous Continuous Dunn, Ryan M, PA-C      . docusate sodium (COLACE) capsule 100 mg  100 mg Oral BID PRN Darel Hong D, NP      . famotidine (PEPCID) IVPB 20 mg premix  20 mg Intravenous Q24H Samaan, Maged, MD      . heparin injection 5,000 Units  5,000 Units Subcutaneous Q8H Darel Hong D, NP   5,000 Units at 05-Dec-2019 1405  . insulin aspart (novoLOG) injection 0-15 Units  0-15 Units Subcutaneous TID WC Darel Hong D, NP      . insulin aspart (novoLOG) injection 0-5 Units   0-5 Units Subcutaneous QHS Darel Hong D, NP      . levETIRAcetam (KEPPRA) IVPB 500 mg/100 mL premix  500 mg Intravenous Q12H Bradly Bienenstock, NP   Stopped at 12/05/19 [phone]  . LORazepam (ATIVAN) injection 1 mg  1 mg Intravenous Q4H PRN Samaan, Maged, MD      . piperacillin-tazobactam (ZOSYN) IVPB 3.375 g  3.375 g Intravenous Q12H Samaan, Maged, MD 12.5 mL/hr at December 05, 2019 1234 3.375 g at 12-05-2019 1234  . polyethylene glycol (MIRALAX / GLYCOLAX) packet 17 g  17 g Oral Daily PRN Darel Hong D, NP      . vancomycin variable dose per unstable renal function (pharmacist dosing)   Does not apply See admin instructions Cassandria Santee, MD       Current Outpatient Medications  Medication Sig Dispense Refill  . albuterol (VENTOLIN HFA) 108 (90 Base) MCG/ACT inhaler Inhale 2 puffs into the lungs every 4 (four) hours as needed for wheezing or shortness of breath.    . allopurinol (ZYLOPRIM) 100 MG tablet  Take 100 mg by mouth daily.    Marland Kitchen ascorbic acid (VITAMIN C) 1000 MG tablet Take 1,000 mg by mouth 2 (two) times daily.    Marland Kitchen atorvastatin (LIPITOR) 80 MG tablet Take 80 mg by mouth daily.    . bumetanide (BUMEX) 2 MG tablet Take 6 mg by mouth 2 (two) times daily. Take an additional 6 mg if your weight increases by 3 lbs in 1 day or 5 lbs in 3 days.    . cetirizine (ZYRTEC) 10 MG tablet Take 10 mg by mouth daily.    . Cholecalciferol 25 MCG (1000 UT) capsule Take 1,000 Units by mouth daily.    . fluticasone (FLONASE) 50 MCG/ACT nasal spray Place 1 spray into both nostrils daily.    . Fluticasone-Salmeterol (ADVAIR) 250-50 MCG/DOSE AEPB Inhale 1 puff into the lungs 2 (two) times daily.    . insulin aspart (NOVOLOG) 100 UNIT/ML FlexPen Inject 12 Units into the skin 2 (two) times daily.    . insulin glargine (LANTUS) 100 UNIT/ML Solostar Pen Inject 30 Units into the skin daily.    Marland Kitchen JARDIANCE 10 MG TABS tablet Take 10 mg by mouth daily.    . metolazone (ZAROXOLYN) 5 MG tablet Take 5 mg by mouth 2 (two)  times daily.    . metoprolol succinate (TOPROL-XL) 25 MG 24 hr tablet Take 25 mg by mouth 2 (two) times daily.    Marland Kitchen omeprazole (PRILOSEC) 20 MG capsule Take 20 mg by mouth in the morning.    . Travoprost, BAK Free, (TRAVATAN) 0.004 % SOLN ophthalmic solution Place 1 drop into the left eye 2 (two) times daily.    Marland Kitchen umeclidinium bromide (INCRUSE ELLIPTA) 62.5 MCG/INH AEPB Inhale 1 puff into the lungs daily.    Marland Kitchen triamcinolone ointment (KENALOG) 0.1 % Apply 1 application topically 2 (two) times daily.        Allergies: Allergies  Allergen Reactions  . Other Hives and Rash  . Penicillins Hives and Rash  . Pollen Extract Itching  . Ace Inhibitors     cough      Past Medical History: No past medical history on file.   Past Surgical History: none  Family History: No family history on file.   Social History: Social History   Socioeconomic History  . Marital status: Single    Spouse name: Not on file  . Number of children: Not on file  . Years of education: Not on file  . Highest education level: Not on file  Occupational History  . Not on file  Tobacco Use  . Smoking status: Not on file  Substance and Sexual Activity  . Alcohol use: Not on file  . Drug use: Not on file  . Sexual activity: Not on file  Other Topics Concern  . Not on file  Social History Narrative  . Not on file   Social Determinants of Health   Financial Resource Strain:   . Difficulty of Paying Living Expenses:   Food Insecurity:   . Worried About Charity fundraiser in the Last Year:   . Arboriculturist in the Last Year:   Transportation Needs:   . Film/video editor (Medical):   Marland Kitchen Lack of Transportation (Non-Medical):   Physical Activity:   . Days of Exercise per Week:   . Minutes of Exercise per Session:   Stress:   . Feeling of Stress :   Social Connections:   . Frequency of Communication with Friends and Family:   .  Frequency of Social Gatherings with Friends and Family:   .  Attends Religious Services:   . Active Member of Clubs or Organizations:   . Attends Archivist Meetings:   Marland Kitchen Marital Status:   Intimate Partner Violence:   . Fear of Current or Ex-Partner:   . Emotionally Abused:   Marland Kitchen Physically Abused:   . Sexually Abused:      Review of Systems: Review of Systems  Unable to perform ROS: Critical illness    Vital Signs: Blood pressure (!) 78/59, pulse 79, resp. rate 13, height 5\' 3"  (1.6 m), weight 124 kg, SpO2 98 %.  Weight trends: Filed Weights   12/11/2019 1747  Weight: 124 kg    Physical Exam: General: Critically ill  Head: Normocephalic, atraumatic. Moist oral mucosal membranes  Eyes: Anicteric, PERRL  Neck: Supple, trachea midline, obese neck  Lungs:  +rhonchi  Heart: irregular  Abdomen:  Soft, nontender,   Extremities: + peripheral edema.  Neurologic: Nonfocal, moving all four extremities  Skin: No lesions  Access: none     Lab results: Basic Metabolic Panel: Recent Labs  Lab 11/24/2019 1743 12-04-2019 0507  NA 136 136  K 3.4* 4.5  CL 80* 83*  CO2 27 22  GLUCOSE 136* 43*  BUN 155* 155*  CREATININE 4.79* 5.01*  CALCIUM 8.6* 8.1*    Liver Function Tests: Recent Labs  Lab 11/30/2019 1743 December 04, 2019 0507  AST 55* 1,386*  ALT 29 586*  ALKPHOS 291* 290*  BILITOT 1.5* 2.1*  PROT 8.1 7.5  ALBUMIN 3.5 3.4*   No results for input(s): LIPASE, AMYLASE in the last 168 hours. Recent Labs  Lab Dec 04, 2019 1448  AMMONIA 59*    CBC: Recent Labs  Lab 11/11/2019 1743 2019-12-04 0507  WBC 14.0* 15.3*  HGB 9.8* 9.7*  HCT 32.5* 32.2*  MCV 87.8 89.9  PLT 260 242    Cardiac Enzymes: No results for input(s): CKTOTAL, CKMB, CKMBINDEX, TROPONINI in the last 168 hours.  BNP: Invalid input(s): POCBNP  CBG: Recent Labs  Lab December 04, 2019 0623 12/04/19 0701 2019/12/04 0851 04-Dec-2019 1242 12/04/19 1339  GLUCAP 128* 97 77 39* 113*    Microbiology: No results found for this or any previous visit.  Coagulation  Studies: No results for input(s): LABPROT, INR in the last 72 hours.  Urinalysis: No results for input(s): COLORURINE, LABSPEC, PHURINE, GLUCOSEU, HGBUR, BILIRUBINUR, KETONESUR, PROTEINUR, UROBILINOGEN, NITRITE, LEUKOCYTESUR in the last 72 hours.  Invalid input(s): APPERANCEUR    Imaging: CT HEAD WO CONTRAST  Result Date: 12/04/19 CLINICAL DATA:  Mental status change, unknown cause.  Seizure. EXAM: CT HEAD WITHOUT CONTRAST TECHNIQUE: Contiguous axial images were obtained from the base of the skull through the vertex without intravenous contrast. COMPARISON:  None. FINDINGS: Brain: Diffuse area of hypoattenuation is present in the right middle temporal gyrus and right parietal lobe without significant ex vacuo dilation. Additional focal area of cortical hypoattenuation is present in high right parietal lobe on image 22 of series 3. Atrophy and diffuse white matter changes are present otherwise. Basal ganglia and insular ribbon are normal. Acute infarct is present scratched at no acute hemorrhage is present. Areas of hypoattenuation are present the cerebellum bilaterally, right greater than left. The ventricles are of normal size. No significant extraaxial fluid collection is present. Vascular: Atherosclerotic changes are present within the cavernous internal carotid arteries. No hyperdense vessel is present. Skull: Calvarium is intact. No focal lytic or blastic lesions are present. No significant extracranial soft tissue lesion is present. Sinuses/Orbits:  Chronic opacification the right sphenoid sinus is noted. The paranasal sinuses and mastoid air cells are otherwise clear. Bilateral lens replacements are noted. Globes and orbits are otherwise unremarkable. IMPRESSION: 1. Diffuse area of hypoattenuation in the right middle temporal gyrus and right parietal lobe without significant ex vacuo dilation. 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 2. Additional focal areas of cortical  hypoattenuation in the high right parietal lobe and bilateral cerebellum are also concerning for infarcts. 3. Atrophy and diffuse white matter changes likely reflect the sequela of chronic microvascular ischemia. 4. Chronic right sphenoid sinus disease. Electronically Signed   By: San Morelle M.D.   On: 12-01-19 04:39   DG Chest Port 1 View  Result Date: 12-01-2019 CLINICAL DATA:  Acute hypoxic respiratory failure. EXAM: PORTABLE CHEST 1 VIEW COMPARISON:  One-view chest x-ray 12/03/2019. FINDINGS: Heart is enlarged. Patient is slightly rotated left despite multiple attempts. Atherosclerotic changes are present at the arch. Bibasilar airspace opacities have increased slightly. Pulmonary vascular congestion is noted. IMPRESSION: 1. Cardiomegaly with increasing pulmonary vascular congestion. 2. Increasing bibasilar airspace disease. While this likely reflects atelectasis, infection is also considered. Electronically Signed   By: San Morelle M.D.   On: 2019-12-01 05:01   DG Chest Portable 1 View  Result Date: 12/01/2019 CLINICAL DATA:  Shortness of breath EXAM: PORTABLE CHEST 1 VIEW COMPARISON:  None. FINDINGS: Cardiomegaly, vascular congestion. No overt edema. No effusions or acute bony abnormality. Aortic atherosclerosis. IMPRESSION: Cardiomegaly, vascular congestion. Electronically Signed   By: Rolm Baptise M.D.   On: 11/11/2019 18:07      Assessment & Plan: Ms. Sophia Mendoza is a 64 y.o. black female with atrial fibrillation, hypertension, diabetes mellitus type II, COPD , who was admitted to [facility] on 12/04/2019 for Unresponsive [R41.89]  1. Acute renal failure with anion gap metabolic acidosis on chronic kidney disease stage IV:   Followed by [facility]. Baseline creatinine of 2.5-2.8. Discharged from [facility] with creatinine of 3.63 and GFR of 12.  Chronic kidney disease secondary to diabetic nephropathy Uremia with BUN of 155. Could be cause of her encephalopathy.  Patient will  need renal replacement therapy.  - Plan on intermittent hemodialysis versus continuous renal replacement with CVVHDF dependant on patient's hemodynamic stability.   2. Anemia of chronic kidney disease: hemoglobin 9.7 - initiate ESA on this admission  3. Hypotension and septic shock secondary to pneumonia: - empiric zosyn and vancomycin.  - vasopressors started.   4. Acute respiratory failure: currently on noninvasive ventilation. - Appreciate critical care input.   LOS: 1 Jermiah Howton Aug 21, 20213:16 PM

## 2019-12-12 NOTE — ED Notes (Signed)
cpr started, no pulse

## 2019-12-12 NOTE — Progress Notes (Signed)
Name: Glendi Mohiuddin MRN: [id number] DOB: [birth year]     CONSULTATION DATE: 11/27/2019 Subjective and objective: Patient remains on BiPAP, somnolent arousable verbally responsive.  She was evaluated by nephrology and planning to start on RRT.  PAST MEDICAL HISTORY :   has no past medical history on file.  has no past surgical history on file. Prior to Admission medications   Medication Sig Start Date End Date Taking? Authorizing Provider  albuterol (VENTOLIN HFA) 108 (90 Base) MCG/ACT inhaler Inhale 2 puffs into the lungs every 4 (four) hours as needed for wheezing or shortness of breath. 02/27/18  Yes [provider]  allopurinol (ZYLOPRIM) 100 MG tablet Take 100 mg by mouth daily. 06/04/19  Yes [provider]  ascorbic acid (VITAMIN C) 1000 MG tablet Take 1,000 mg by mouth 2 (two) times daily. 11/16/19  Yes [provider]  atorvastatin (LIPITOR) 80 MG tablet Take 80 mg by mouth daily. 11/16/19  Yes [provider]  bumetanide (BUMEX) 2 MG tablet Take 6 mg by mouth 2 (two) times daily. Take an additional 6 mg if your weight increases by 3 lbs in 1 day or 5 lbs in 3 days. 11/16/19  Yes [provider]  cetirizine (ZYRTEC) 10 MG tablet Take 10 mg by mouth daily. 08/15/19 08/14/20 Yes [provider]  Cholecalciferol 25 MCG (1000 UT) capsule Take 1,000 Units by mouth daily.   Yes [provider]  fluticasone (FLONASE) 50 MCG/ACT nasal spray Place 1 spray into both nostrils daily. 08/14/15  Yes [provider]  Fluticasone-Salmeterol (ADVAIR) 250-50 MCG/DOSE AEPB Inhale 1 puff into the lungs 2 (two) times daily. 06/03/17  Yes [provider]  insulin aspart (NOVOLOG) 100 UNIT/ML FlexPen Inject 12 Units into the skin 2 (two) times daily. 05/17/19  Yes [provider]  insulin glargine (LANTUS) 100 UNIT/ML Solostar Pen Inject 30 Units into the skin daily. 08/06/19  Yes [provider]  JARDIANCE 10 MG TABS tablet Take  10 mg by mouth daily. 11/16/19  Yes [provider]  metolazone (ZAROXOLYN) 5 MG tablet Take 5 mg by mouth 2 (two) times daily. 11/16/19  Yes [provider]  metoprolol succinate (TOPROL-XL) 25 MG 24 hr tablet Take 25 mg by mouth 2 (two) times daily. 11/16/19  Yes [provider]  omeprazole (PRILOSEC) 20 MG capsule Take 20 mg by mouth in the morning. 08/28/18  Yes [provider]  Travoprost, BAK Free, (TRAVATAN) 0.004 % SOLN ophthalmic solution Place 1 drop into the left eye 2 (two) times daily.   Yes [provider]  umeclidinium bromide (INCRUSE ELLIPTA) 62.5 MCG/INH AEPB Inhale 1 puff into the lungs daily. 02/28/18  Yes [provider]  triamcinolone ointment (KENALOG) 0.1 % Apply 1 application topically 2 (two) times daily.    [provider]   Allergies  Allergen Reactions   Other Hives and Rash   Penicillins Hives and Rash   Pollen Extract Itching   Ace Inhibitors     cough    FAMILY HISTORY:  family history is not on file. SOCIAL HISTORY:    REVIEW OF SYSTEMS:   Unable to obtain due to critical illness      Estimated body mass index is 48.43 kg/m as calculated from the following:   Height as of this encounter: 5\' 3"  (1.6 m).   Weight as of this encounter: 124 kg.    VITAL SIGNS: Pulse Rate:  [79-95] 79 (08/10 1406) Resp:  [13-22] 13 (08/10 1406)  BP: (66-115)/(46-76) 78/59 (08/10 1406) SpO2:  [92 %-100 %] 98 % (08/10 1406) FiO2 (%):  [80 %] 80 % (08/10 0245) Weight:  [124 kg] 124 kg (08/09 1747)   I/O last 3 completed shifts: In: 1300 [IV Piggyback:1300] Out: -  Total I/O In: 600 [IV Piggyback:600] Out: -    SpO2: 98 % O2 Flow Rate (L/min): 20.1 L/min FiO2 (%): 80 %   Physical Examination:  Somnolent, arousable to verbal stimulation, oriented with no focal motor deficits Tolerating BiPAP, no distress, bilateral equal air entry with no adventitious sounds S1 & S2 are audible with no  murmur Benign abdomen with normal peristalsis Wasted extremities with mild bilateral pedal edema   CULTURE RESULTS   No results found for this or any previous visit (from the past 240 hour(s)).        IMAGING    CT HEAD WO CONTRAST  Result Date: 11-24-2019 CLINICAL DATA:  Mental status change, unknown cause.  Seizure. EXAM: CT HEAD WITHOUT CONTRAST TECHNIQUE: Contiguous axial images were obtained from the base of the skull through the vertex without intravenous contrast. COMPARISON:  None. FINDINGS: Brain: Diffuse area of hypoattenuation is present in the right middle temporal gyrus and right parietal lobe without significant ex vacuo dilation. Additional focal area of cortical hypoattenuation is present in high right parietal lobe on image 22 of series 3. Atrophy and diffuse white matter changes are present otherwise. Basal ganglia and insular ribbon are normal. Acute infarct is present scratched at no acute hemorrhage is present. Areas of hypoattenuation are present the cerebellum bilaterally, right greater than left. The ventricles are of normal size. No significant extraaxial fluid collection is present. Vascular: Atherosclerotic changes are present within the cavernous internal carotid arteries. No hyperdense vessel is present. Skull: Calvarium is intact. No focal lytic or blastic lesions are present. No significant extracranial soft tissue lesion is present. Sinuses/Orbits: Chronic opacification the right sphenoid sinus is noted. The paranasal sinuses and mastoid air cells are otherwise clear. Bilateral lens replacements are noted. Globes and orbits are otherwise unremarkable. IMPRESSION: 1. Diffuse area of hypoattenuation in the right middle temporal gyrus and right parietal lobe without significant ex vacuo dilation. 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 2. Additional focal areas of cortical hypoattenuation in the high right parietal lobe and bilateral cerebellum are also  concerning for infarcts. 3. Atrophy and diffuse white matter changes likely reflect the sequela of chronic microvascular ischemia. 4. Chronic right sphenoid sinus disease. Electronically Signed   By: San Morelle M.D.   On: November 24, 2019 04:39   DG Chest Port 1 View  Result Date: 11-24-2019 CLINICAL DATA:  Acute hypoxic respiratory failure. EXAM: PORTABLE CHEST 1 VIEW COMPARISON:  One-view chest x-ray 11/23/2019. FINDINGS: Heart is enlarged. Patient is slightly rotated left despite multiple attempts. Atherosclerotic changes are present at the arch. Bibasilar airspace opacities have increased slightly. Pulmonary vascular congestion is noted. IMPRESSION: 1. Cardiomegaly with increasing pulmonary vascular congestion. 2. Increasing bibasilar airspace disease. While this likely reflects atelectasis, infection is also considered. Electronically Signed   By: San Morelle M.D.   On: 2019/11/24 05:01   DG Chest Portable 1 View  Result Date: 12/06/2019 CLINICAL DATA:  Shortness of breath EXAM: PORTABLE CHEST 1 VIEW COMPARISON:  None. FINDINGS: Cardiomegaly, vascular congestion. No overt edema. No effusions or acute bony abnormality. Aortic atherosclerosis. IMPRESSION: Cardiomegaly, vascular congestion. Electronically Signed   By: Rolm Baptise M.D.   On: 12/01/2019 18:07     Assessment and plan: Acute  on chronic respiratory failure tolerating BiPAP.  Baseline home O2 6 L/min with obesity hypoventilation syndrome and body habitus suggestive of OSA. -Monitor work of breathing, ABG and maintain O2 sat 88 to 92% -Consider intubation if needed.  Altered mental status likely with toxic metabolic encephalopathy as discussed with neurology.  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 -Monitor neuro status -Keppra -MR of the brain with and without contrast -Carotid ultrasound -EEG -Aspirin and management as per neurology  Pneumonia.  Bibasilar airspace  disease -Empiric Vanco and Zosyn -Monitor chest x-ray, CBC, FiO2 requirement  Septic shock -Empiric Zosyn and vancomycin -De-escalate was further sepsis work-up  AKI on CKD. -Management as per nephrology and planning to start RRT  Elevated troponin was demand versus supply mismatch in no acute specific ST segment changes -Monitor cardiac enzymes and follows echocardiogram  Uncontrolled diabetes mellitus with episodes of hypoglycemia -Optimize glycemic control  Elevated alkaline phosphatase to rule out gallbladder disease  History of A. fib not on anticoagulation  Anemia of chronic disease  DVT & GI prophylaxis.  Continue with supportive care  Case was discussed with nephrology and neurology.  Awaiting cardiology input  Critical care time 35 minutes

## 2019-12-12 NOTE — ED Notes (Signed)
Pt with critical blood glucose. Spoke to PA. See MAR.  Attempted to cath pt with PA permission. Very small amount of urine obtained. Will send to lab

## 2019-12-12 NOTE — ED Notes (Signed)
Heart rate 100 wide complex

## 2019-12-12 NOTE — Progress Notes (Signed)
Pt extubated to Comfort care per MD order. RN and MD at bedside.

## 2019-12-12 NOTE — ED Notes (Signed)
Iv meds infusing.  afib on monitor at 104.  No urine in foley bag.  Pressure 81/53. Oxygen sats 100% on vent.

## 2019-12-12 NOTE — ED Notes (Signed)
Comfort measures done.  Iv fluids and meds turned off.  Pt extubated by RT.  Family and md at bedside with chaplin.

## 2019-12-12 NOTE — Progress Notes (Signed)
Pt noted to have increasing WBC of 15.3, and procalcitonin of 1.02.  Will perform sepsis workup and place on empiric Vancomycin and Zosyn for now.     Darel Hong, AGACNP-BC [facility] Pager: [phone]

## 2019-12-12 NOTE — ED Notes (Signed)
Family with pt now. Chaplin in room too.

## 2019-12-12 NOTE — Consult Note (Signed)
Cardiology Consultation:   Patient ID: Sophia Mendoza; [id number]; 12-07-1955   Admit date: 12/11/2019 Date of Consult: 23-Nov-2019  Primary Care Provider: System, Pcp Not In Primary Cardiologist: [facility] Primary Electrophysiologist:  None   Patient Profile:   Sophia Mendoza is a 64 y.o. female with a hx of right-sided heart failure with pulmonary hypertension, HFpEF, CKD stage IV, A. fib/flutter, SVT, chronic hypoxic respiratory failure requiring supplemental oxygen at 6 L via nasal cannula, DM2, sleep apnea, obesity hypoventilation syndrome, GI bleed, morbid obesity, HTN, HLD, iron deficiency anemia, and GERD who is being seen today for the evaluation of respiratory distress at the request of Dr. Soyla Murphy.  History of Present Illness:   [name] has a complex medical history and is followed by [facility].  She had several admissions back in 2017 requiring IV diuresis and briefly requiring inotropic support to augment diuresis.  RHC revealed elevated right heart pressures and she was subsequently diagnosed with pulmonary hypertension with initiation of sildenafil prompting discontinuation of inotropic support.  Later in 2017 she was admitted with a GI bleed with GI work-up being unrevealing and Xarelto was held at discharge.  At some point, this was reinitiated and she was admitted in 2018 x 2 with repeat GI bleed after rechallenge with multiple DOAC.  In this setting, she has refused oral anticoagulation and has not been felt to be a good candidate for warfarin.  She later required admission to [facility] in 2018 for AKI in the setting of overdiuresis.  She was admitted in 10/2017 with acute decompensated CHF treated with dobutamine and IV diuresis with admission complicated by E. coli bacteremia.  She underwent RHC during that admission with a mean PA P 40, PCWP 19, and PVR 2.8.  She was admitted in 04/2018 with acute on chronic hypoxic respiratory failure after running out of home O2 and volume overload.  She again  required dobutamine assistance with diuresis.  Zio patch in 01/2019 for palpitations showed frequent SVT versus atrial flutter leading to escalation of her metoprolol.  She was recently admitted to [facility] from 10/28/2019 through 11/17/2019 for right heart failure with acute on chronic HFpEF and WHO group II and III pulmonary hypertension with noted inconsistent adherence to home diuretic regimen with noted 50 pound weight gain dating back to 08/2019.  She reported a prior dry weight of 231 pounds echo showed preserved LV SF greater than 55% though was overall a technically difficult study.  She was diuresed with IV Bumex with minimal effect with addition of metolazone with the subsequently being held due to worsening renal function.  In this setting, she was transferred to [facility] main campus and started on IV Bumex drip with metolazone with improved urine output.  Documented discharge weight uncertain.  She was continued on her home regimen of Incruse and Breo daily along with CPAP and 6 L nasal cannula during the day with as needed albuterol for pulmonary hypertension.  With regards to her history of A. fib/flutter anticoagulation continue to be deferred and she was ultimately discharged on Toprol-XL.  She presented to [facility] 2 days following her [facility] discharge with intermittent episodes of unresponsiveness with concern for possible seizure-like behavior, and hypotensive with systolic blood pressure in the 70s with noted hypoxia.  Upon arrival to [facility] ED she was awake and alert and able to answer all questions, following all basic commands.  She was noted to have AKI with a serum creatinine of 4.79 with a BUN of 155, initial high-sensitivity  of 82 with a delta of 93, BNP 1700 trending to 850, PCT 1.02, ABG with pH 7.26, PCO2 44, PO2 84, ammonia 59, lactic acid 9.7, Hgb 9.8, WBC 14.0, potassium 3.4, AST 55-->1386, ALT 29-->586, alkaline phosphatase 290.  Chest x-ray showed cardiomegaly with vascular congestion.  CT head  showed a diffuse area of hypoattenuation in the right middle temporal gyrus and right parietal lobe without significant ex vacuo dilatation consistent with age-indeterminate infarct as well as additional focal areas of cortical hypoattenuation in the high right parietal lobe and bilateral cerebellum concerning for infarcts.  Neurology has been consulted with recommendation for MRI brain and MRA head as well as EEG and continuation of Keppra.   Past Medical History:  Diagnosis Date   (HFpEF) heart failure with preserved ejection fraction (HCC)    Back pain    Chronic respiratory failure with hypoxia (HCC)    CKD (chronic kidney disease), stage III    COPD (chronic obstructive pulmonary disease) (HCC)    Depression    Diabetes mellitus type 2 in obese (HCC)    GERD (gastroesophageal reflux disease)    Glaucoma    HTN (hypertension)    Iron deficiency anemia    Morbid obesity (HCC)    OSA (obstructive sleep apnea)    PAF (paroxysmal atrial fibrillation) (HCC)    Postmenopausal bleeding    Pulmonary hypertension (HCC)    Seizure-like activity (HCC)    SVT (supraventricular tachycardia) ([place])      Home Meds: Prior to Admission medications   Medication Sig Start Date End Date Taking? Authorizing Provider  albuterol (VENTOLIN HFA) 108 (90 Base) MCG/ACT inhaler Inhale 2 puffs into the lungs every 4 (four) hours as needed for wheezing or shortness of breath. 02/27/18  Yes [provider]  allopurinol (ZYLOPRIM) 100 MG tablet Take 100 mg by mouth daily. 06/04/19  Yes [provider]  ascorbic acid (VITAMIN C) 1000 MG tablet Take 1,000 mg by mouth 2 (two) times daily. 11/16/19  Yes [provider]  atorvastatin (LIPITOR) 80 MG tablet Take 80 mg by mouth daily. 11/16/19  Yes [provider]  bumetanide (BUMEX) 2 MG tablet Take 6 mg by mouth 2 (two) times daily. Take an additional 6 mg if your weight increases by 3 lbs in 1 day or 5 lbs in 3 days.  11/16/19  Yes [provider]  cetirizine (ZYRTEC) 10 MG tablet Take 10 mg by mouth daily. 08/15/19 08/14/20 Yes [provider]  Cholecalciferol 25 MCG (1000 UT) capsule Take 1,000 Units by mouth daily.   Yes [provider]  fluticasone (FLONASE) 50 MCG/ACT nasal spray Place 1 spray into both nostrils daily. 08/14/15  Yes [provider]  Fluticasone-Salmeterol (ADVAIR) 250-50 MCG/DOSE AEPB Inhale 1 puff into the lungs 2 (two) times daily. 06/03/17  Yes [provider]  insulin aspart (NOVOLOG) 100 UNIT/ML FlexPen Inject 12 Units into the skin 2 (two) times daily. 05/17/19  Yes [provider]  insulin glargine (LANTUS) 100 UNIT/ML Solostar Pen Inject 30 Units into the skin daily. 08/06/19  Yes [provider]  JARDIANCE 10 MG TABS tablet Take 10 mg by mouth daily. 11/16/19  Yes [provider]  metolazone (ZAROXOLYN) 5 MG tablet Take 5 mg by mouth 2 (two) times daily. 11/16/19  Yes [provider]  metoprolol succinate (TOPROL-XL) 25 MG 24 hr tablet Take 25 mg by mouth 2 (two) times daily. 11/16/19  Yes [provider]  omeprazole (PRILOSEC) 20 MG capsule Take  20 mg by mouth in the morning. 08/28/18  Yes [provider]  Travoprost, BAK Free, (TRAVATAN) 0.004 % SOLN ophthalmic solution Place 1 drop into the left eye 2 (two) times daily.   Yes [provider]  umeclidinium bromide (INCRUSE ELLIPTA) 62.5 MCG/INH AEPB Inhale 1 puff into the lungs daily. 02/28/18  Yes [provider]  triamcinolone ointment (KENALOG) 0.1 % Apply 1 application topically 2 (two) times daily.    [provider]    Inpatient Medications: Scheduled Meds:  aspirin EC  325 mg Oral Daily   dextrose       heparin  5,000 Units Subcutaneous Q8H   insulin aspart  0-15 Units Subcutaneous TID WC   insulin aspart  0-5 Units Subcutaneous QHS   vancomycin variable dose per unstable renal function (pharmacist dosing)    Does not apply See admin instructions   Continuous Infusions:  albumin human     dextrose 5 % and 0.9% NaCl 50 mL/hr at 12-15-2019 0633   DOBUTamine     famotidine (PEPCID) IV     levETIRAcetam Stopped (12/15/19 0607)   piperacillin-tazobactam (ZOSYN)  IV 3.375 g (2019/12/15 1234)   PRN Meds: docusate sodium, LORazepam, polyethylene glycol  Allergies:   Allergies  Allergen Reactions   Other Hives and Rash   Penicillins Hives and Rash   Pollen Extract Itching   Ace Inhibitors     cough    Social History:   Social History   Socioeconomic History   Marital status: Single    Spouse name: Not on file   Number of children: Not on file   Years of education: Not on file   Highest education level: Not on file  Occupational History   Not on file  Tobacco Use   Smoking status: Not on file  Substance and Sexual Activity   Alcohol use: Not on file   Drug use: Not on file   Sexual activity: Not on file  Other Topics Concern   Not on file  Social History Narrative   Not on file   Social Determinants of Health   Financial Resource Strain:    Difficulty of Paying Living Expenses:   Food Insecurity:    Worried About Running Out of Food in the Last Year:    Human resources officer of Food in the Last Year:   Transportation Needs:    Film/video editor (Medical):    Lack of Transportation (Non-Medical):   Physical Activity:    Days of Exercise per Week:    Minutes of Exercise per Session:   Stress:    Feeling of Stress :   Social Connections:    Frequency of Communication with Friends and Family:    Frequency of Social Gatherings with Friends and Family:    Attends Religious Services:    Active Member of Clubs or Organizations:    Attends Music therapist:    Marital Status:   Intimate Partner Violence:    Fear of Current or Ex-Partner:    Emotionally Abused:    Physically Abused:    Sexually Abused:      Family History:    Family History  Problem Relation Age of Onset   Glaucoma Mother    Heart failure Sister    Cancer Maternal Grandmother     ROS:  Review of Systems  Unable to perform ROS: Acuity of condition      Physical Exam/Data:   Vitals:   12/15/2019 1530 2019/12/15 1531 2019-12-15 1532  2019/12/13 1534  BP: (!) 77/59   (!) 69/59  Pulse:      Resp: 14 16 18 12   SpO2:      Weight:      Height:        Intake/Output Summary (Last 24 hours) at 2019/12/13 1540 Last data filed at Dec 13, 2019 1142 Gross per 24 hour  Intake 1900 ml  Output --  Net 1900 ml   Filed Weights   12/11/2019 1747  Weight: 124 kg   Body mass index is 48.43 kg/m.   Physical Exam: General: Somnolent though will open eyes when asked, ill-appearing. Head: Normocephalic, atraumatic, sclera non-icteric, no xanthomas, nares without discharge.  Neck: Negative for carotid bruits. JVD difficult to assess secondary to respiratory support apparatus and body habitus. Lungs: Diminished breath sounds bilaterally on BiPAP. Breathing is unlabored. Heart: RRR with S1 S2. No murmurs, rubs, or gallops appreciated. Abdomen: Soft, non-tender, distended with normoactive bowel sounds. No hepatomegaly. No rebound/guarding. No obvious abdominal masses. Msk:  Strength and tone appear normal for age. Extremities: Cool to the touch.  No clubbing or cyanosis. No edema. Distal pedal pulses are 2+ and equal bilaterally. Neuro: Somnolent though will open eyes when asked. No facial asymmetry. Psych: Somnolent.   EKG:  The EKG was personally reviewed and demonstrates: NSR, 84 bpm, rare PAC, low voltage QRS, nonspecific ST-T changes Telemetry:  Telemetry was personally reviewed and demonstrates: SR  Weights: [employer]   11/23/2019 1747  Weight: 124 kg    Relevant CV Studies:  2D echo 11/01/2019 [facility]): Summary  1. Technically VERY difficult study.  2. The left ventricle is normal in size with increased wall thickness.  3. The left  ventricular systolic function is overall normal, LVEF is  visually estimated at > 55%.    Laboratory Data:  Chemistry Recent Labs  Lab 12/05/2019 1743 12-13-2019 0507  NA 136 136  K 3.4* 4.5  CL 80* 83*  CO2 27 22  GLUCOSE 136* 43*  BUN 155* 155*  CREATININE 4.79* 5.01*  CALCIUM 8.6* 8.1*  GFRNONAA 9* 8*  GFRAA 10* 10*  ANIONGAP 29* 31*    Recent Labs  Lab 12/08/2019 1743 December 13, 2019 0507  PROT 8.1 7.5  ALBUMIN 3.5 3.4*  AST 55* 1,386*  ALT 29 586*  ALKPHOS 291* 290*  BILITOT 1.5* 2.1*   Hematology Recent Labs  Lab 11/23/2019 1743 12-13-19 0507  WBC 14.0* 15.3*  RBC 3.70* 3.58*  HGB 9.8* 9.7*  HCT 32.5* 32.2*  MCV 87.8 89.9  MCH 26.5 27.1  MCHC 30.2 30.1  RDW 18.9* 19.1*  PLT 260 242   Cardiac EnzymesNo results for input(s): TROPONINI in the last 168 hours. No results for input(s): TROPIPOC in the last 168 hours.  BNP Recent Labs  Lab 12/10/2019 1743 Dec 13, 2019 0507  BNP 1,712.6* 850.9*    DDimer No results for input(s): DDIMER in the last 168 hours.  Radiology/Studies:  CT HEAD WO CONTRAST  Result Date: 2019-12-13 IMPRESSION: 1. Diffuse area of hypoattenuation in the right middle temporal gyrus and right parietal lobe without significant ex vacuo dilation. 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 2. Additional focal areas of cortical hypoattenuation in the high right parietal lobe and bilateral cerebellum are also concerning for infarcts. 3. Atrophy and diffuse white matter changes likely reflect the sequela of chronic microvascular ischemia. 4. Chronic right sphenoid sinus disease. Electronically Signed   By: San Morelle M.D.   On: 12-13-19 04:39   DG Chest [place]  Result Date: 2019/12/19 IMPRESSION: 1. Cardiomegaly with increasing pulmonary vascular congestion. 2. Increasing bibasilar airspace disease. While this likely reflects atelectasis, infection is also considered. Electronically Signed   By: San Morelle M.D.   On:  12/19/19 05:01   DG Chest Portable 1 View  Result Date: 11/21/2019 IMPRESSION: Cardiomegaly, vascular congestion. Electronically Signed   By: Rolm Baptise M.D.   On: 11/18/2019 18:07    Assessment and Plan:   1. Cardiogenic shock/pulmonary hypertension/right-sided heart failure: -Likely driven primarily by pulmonary hypertension with RV failure contributing to possible LV failure -Ideally, would place patient on milrinone though with systolic blood pressures in the 70s at this time this would drop her pressures further -Start dobutamine with consideration for addition of Levophed as indicated -Escalate IV diuresis as able, may need Bumex drip as she appeared to have responded well to this at [facility] during recent admission -Patient's multiorgan failure including renal and liver is concerning for renal congestion and congestive hepatopathy in the setting of RV failure -Recommend central line placement which can be coordinated by PCCM -Echo pending -Patient son is advocating for transfer to [facility], coordinating this will be deferred to to the primary service  2.  A. fib/flutter: -Maintaining sinus rhythm at this time -Hold metoprolol secondary to hypotension -Has not been felt to be a candidate for anticoagulation secondary to recurrent GI bleed -Patient now with incidentally noted age-indeterminate ischemic infarcts -CHA2DS2-VASc at least 7 (CHF, HTN, age x1, diabetes, stroke x2, gender) -With noted infarcts on imaging may need to revisit anticoagulation, though this can be deferred to her primary cardiology group  3.  Acute on CKD stage IV: -Likely ATN in setting of congestion and and hypotension -Nephrology has been consulted for consideration of CRRT -Hopefully with improved BP renal function will follow and IV diuresis can be undertaken  4.  Congestive hepatopathy: -As above  5.  Acute on chronic hypoxic respiratory failure: -Currently requiring BiPAP -At baseline she uses 6 L via  nasal cannula -Wean back to baseline as tolerated per primary service  6.  Remote CVA: -Noted on CT head with MRI brain and MRA head pending -Of note, patient has history of A. fib/flutter though has been unable to be anticoagulated secondary to recurrent GI bleeding, pending clinical course this may need to be revisited -Neurology is following     For questions or updates, please contact [facility] Please consult [email or web address] for contact info under Cardiology/STEMI.   Signed, Christell Faith, PA-C [place] Pager: [phone] 12-19-2019, 3:40 PM

## 2019-12-12 NOTE — ED Notes (Signed)
D5NS infusing

## 2019-12-12 NOTE — ED Notes (Signed)
Pt had a focal neuro seizure with this RN at bedside lasting approx 30 seconds. PT returned to alert and responsive. Intensivist PA made aware. See orders.

## 2019-12-12 NOTE — Progress Notes (Signed)
Pharmacy Antibiotic Note  Sophia Mendoza is a 64 y.o. female admitted on 11/18/2019 with sepsis.  Pharmacy has been consulted for vanc/zosyn dosing.  Plan: Will give patient vanc 1.75g IV load and dose per levels for now given patient has AKI w/o documented kidney disease  Will start zosyn 3.375g IV q12h per CrCl < 20 ml/min not on dialysis. Will continue to monitor and adjust doses per changes in renal function.  Goal random < 20 mcg/mL.  Height: 5\' 3"  (160 cm) Weight: 124 kg (273 lb 5.9 oz) IBW/kg (Calculated) : 52.4  No data recorded.  Recent Labs  Lab 11/17/2019 1743 01-Dec-2019 0507  WBC 14.0* 15.3*  CREATININE 4.79* 5.01*    Estimated Creatinine Clearance: 14.1 mL/min (A) (by C-G formula based on SCr of 5.01 mg/dL (H)).    Allergies  Allergen Reactions  . Other Hives and Rash  . Penicillins Hives and Rash  . Pollen Extract Itching  . Ace Inhibitors     cough    Thank you for allowing pharmacy to be a part of this patient's care.  Tobie Lords, PharmD, BCPS Clinical Pharmacist 2019/12/01 7:21 AM

## 2019-12-12 NOTE — ED Notes (Signed)
[facility] called.  This rn spoke with stephanie.  Referral number [id number]

## 2019-12-12 NOTE — ED Notes (Signed)
Second attempt to in and out patient for urine specimen. Catheter became clogged upon insertion with a thick substance and allowed zero urine return.

## 2019-12-12 NOTE — ED Notes (Signed)
Pt has pulse again.

## 2019-12-12 NOTE — ED Notes (Signed)
Repeat abg done  md and family with pt.

## 2019-12-12 NOTE — ED Notes (Signed)
Informed PA of BP. See mar

## 2019-12-12 NOTE — ED Notes (Signed)
[place] in with md, rn, family and pt.

## 2019-12-12 NOTE — ED Notes (Signed)
Family in with pt now   md at bedside.

## 2019-12-12 NOTE — ED Notes (Signed)
Resumed care from [place] rn.  Pt on bipap  nsr on monitor.  Iv fluids and meds infusing.  Pt waiting on admission bed.

## 2019-12-12 NOTE — ED Notes (Signed)
IV team at bedside 

## 2019-12-12 NOTE — ED Notes (Signed)
ultrasound at bedside.

## 2019-12-12 NOTE — ED Notes (Signed)
cpr again in progress  meds given again  Family and md at bedside.

## 2019-12-12 NOTE — ED Notes (Signed)
fsbs 110  md aware.

## 2019-12-12 NOTE — ED Provider Notes (Addendum)
Patient received in signout from Dr. Wilhemina Cash at 3pm.  Patient has been boarding for approximately 24 hours in the ED.  Admitted to hospitalist service for CHF exacerbation on BiPAP.  Cardiology and neurology consultations are noted.  Intensivist consulted for ICU admission, and he has evaluated the patient and I meet him at the bedside at the beginning of my shift this afternoon, at approximately 3:30 PM.  We discussed patient's worsening clinical status and mental status on BiPAP, she is now somnolent and not following commands.  Due to risk of aspiration and further respiratory decline, we agree that intubation is necessary for patient safety and further physiologic corrections.  Central line placed at the bedside without complication, as separately documented below.  Started Levophed at 10 mcg/min due to soft blood pressures and expected hemodynamic decline in the setting of intubation and intrathoracic pressure.  Intubation went smoothly and passed ETT on first attempt, good color change.  Post line CXR confirms ETT 1 cm above the carina and right IJ in good positioning.  About 8-10 minutes after leave the room from her intubation, RN grabs me from another room to tell me that patient's rhythm on telemetry is changed and that her blood pressure is dropping.  Immediately reassessed the patient and note A. fib with slow rates 50-70, wide-complex in nature and blood pressure with maps in the 40s.  1 mg of atropine was attempted without improvement of her clinical status.  As her heart rate slowed, we lost pulses and initiated chest compressions.  Patient coded for approximately 7 minutes before ROSC was achieved.  I was concerned about hyperkalemia and that the source of her arrest, wide-complex A. fib rhythm resolved after calcium administration and was not consistent with V. tach, V. fib or her known A. Fib.  1 additional brief code of similar, as documented below, concerning for wide-complex rhythm  consistent with hyperkalemia.  Despite maximal resuscitative efforts, patient continued to clinically decline.  Frequent conversations at the bedside with patient's son and POA, Freida Busman, and copious other family members yields understanding that patient would not want to suffer in a situation such as this.  At this point patient was on dobutamine drip, maxed out on Levophed up to 50 mics per minute and on vasopressin, maps remain in the 50s-60s, no urine output.  Patient is agreeable to changes the patient to DNR status and comfort care measures as she would never want this invasive care performed with a poor prognosis in the setting of multiorgan failure.  Patient palliative extubated at the bedside, facilitated by morphine and Ativan, quickly passed thereafter with patient's family surrounding her.  Time of death 07-12-2052.  Clinical Course as of Nov 20 2318  Tue Nov 20, 2019  1744 Updated son , Freida Busman, at the bedside of 7 minute downtime. Explained severity of her critical illness. He had to run back out front and move his car   [DS]  1749 Repeat ABG demonstrates metabolic acidosis, pH 0.94. Stat repeat labs sent. OG placed and stomach decompressed. Levophed at 30 mics per minutes, maps in the upper 50s/low 60s.  Advised nurse that we will start vasopressin next.  I ordered this. CXR reviewed at the time of its taking, demonstrates good placement of right IJ catheter and ETT.   [DS]  1750 During the code, patient was provided x2 1 mg epinephrines.  X2 1 g calcium. X1 1mg  atropine to start before compressions initiated.  Stat EKG once ROSC was achieved demonstrates wide  complex A. fib rhythm.  I observed on telemetry this morphology changed to a narrow complex A. fib with rates in 70s-100.  I was concerned about hyperkalemia as the source of patient's arrest and wide-complex tachycardia.  Advised RN that we will provide a 3rd g of IV calcium.  Postarrest glucose noted to be 61, 1 amp of D50 was provided and  patient was started on a drip of D5.   [DS]  1610 Spoke with intensivist on the phone.  Educated intensivist on arrest and my concern for hyperkalemia.  We discussed the actions of already taken, and he agrees to reassess the patient now.   [DS]  9604 Brief recurrent code of similar morphology.  Calcium provided.  I am trying to arrange respiratory and RN to provide plain 10 units of IV insulin, 80 mg of IV Lasix, Foley placement and 15 mg of albuterol through the vent   [DS]  [place] conversation of the bedside with patient's son and POA, Freida Busman.  We discussed patient's critical illness and uncertain prognosis.  Patient becomes understandably upset, chaplain is present, and he expresses grief and indecision that he "has to make this decision today."  I advised the patient's son to confer with patient's long-term boyfriend of 78 years, who know her best, and to ensure that everyone is on the same page about how she would want to be cared forward in a situation such as this.   [DS]  Datto and other son at the bedside. We discussed her worsening clinical status, multisystem organ failure and clinical worsening despite our best efforts. Brothers are discussing at the bedside.   [DS]  2009 Further discussions with family at the bedside indicate plan for comfort measures and palliative extubation. Crit care doc to the bedside, appreciative of assistance in care   [DS]  2034 Patient palliatively extubated to room air by respiratory therapy after morphine and Ativan administration.  Family at the bedside.   [DS]    Clinical Course User Index [DS] Vladimir Crofts, MD    CRITICAL CARE Performed by: Vladimir Crofts   Total critical care time: 110 minutes  Critical care time was exclusive of separately billable procedures and treating other patients.  Critical care was necessary to treat or prevent imminent or life-threatening deterioration.  Critical care was time spent personally by me on  the following activities: development of treatment plan with patient and/or surrogate as well as nursing, discussions with consultants, evaluation of patient's response to treatment, examination of patient, obtaining history from patient or surrogate, ordering and performing treatments and interventions, ordering and review of laboratory studies, ordering and review of radiographic studies, pulse oximetry and re-evaluation of patient's condition.  Marland Kitchen1-3 Lead EKG Interpretation Performed by: Vladimir Crofts, MD Authorized by: Vladimir Crofts, MD     Interpretation: abnormal     ECG rate:  100   ECG rate assessment: tachycardic     Rhythm: atrial fibrillation     Ectopy: PVCs     Conduction: abnormal (wide complex rhythm, likely due to hyperkalemia)   Procedure Name: Intubation Date/Time: 12/08/19 11:34 PM Performed by: Vladimir Crofts, MD Pre-anesthesia Checklist: Patient identified, Emergency Drugs available, Suction available, Patient being monitored and Timeout performed Oxygen Delivery Method: Ambu bag Induction Type: Rapid sequence Ventilation: Two handed mask ventilation required Laryngoscope Size: Glidescope and 3 Grade View: Grade III Tube size: 7.5 mm Number of attempts: 1 Airway Equipment and Method: Rigid stylet Placement Confirmation: ETT inserted through vocal cords under  direct vision,  Positive ETCO2,  CO2 detector and Breath sounds checked- equal and bilateral Secured at: 22 cm Tube secured with: ETT holder Dental Injury: Teeth and Oropharynx as per pre-operative assessment  Difficulty Due To: Difficulty was unanticipated    .Central Line  Date/Time: 02-Dec-2019 11:36 PM Performed by: Vladimir Crofts, MD Authorized by: Vladimir Crofts, MD   Consent:    Consent obtained:  Verbal   Consent given by:  Patient and healthcare agent   Risks discussed:  Arterial puncture, incorrect placement and infection   Alternatives discussed:  No treatment Anesthesia (see MAR for exact  dosages):    Anesthesia method:  Local infiltration   Local anesthetic:  Lidocaine 1% w/o epi Procedure details:    Location:  R internal jugular   Patient position:  Flat   Procedural supplies:  Triple lumen   Landmarks identified: yes     Ultrasound guidance: yes     Sterile ultrasound techniques: Sterile gel and sterile probe covers were used     Number of attempts:  1   Successful placement: yes   Post-procedure details:    Post-procedure:  Dressing applied and line sutured   Assessment:  Blood return through all ports, no pneumothorax on x-ray, free fluid flow and placement verified by x-ray   Patient tolerance of procedure:  Tolerated well, no immediate complications        Vladimir Crofts, MD 12/02/2019 0156    Vladimir Crofts, MD 2019/12/02 2337

## 2019-12-12 NOTE — ED Notes (Signed)
md at bedside  Blood pressure 61/39.  Heart rate 40-68.

## 2019-12-12 NOTE — ED Notes (Signed)
Dr Tamala Julian in with pt to place a central line.

## 2019-12-12 NOTE — ED Notes (Signed)
16fr triple lumen inserted into right ij.  Pt tolerated without diff.  nsr on monitor.

## 2019-12-12 NOTE — ED Notes (Signed)
intensivist in with pt and dr Tamala Julian in with pt.

## 2019-12-12 NOTE — ED Notes (Signed)
Repeat ekg now.  Wide complex on monitor after cpr.

## 2019-12-12 NOTE — ED Notes (Signed)
Family with pt

## 2019-12-12 NOTE — Progress Notes (Signed)
*  PRELIMINARY RESULTS* Echocardiogram 2D Echocardiogram has been performed.  Sophia Mendoza 12/14/19, 12:01 PM

## 2019-12-12 NOTE — ED Notes (Signed)
Dr Soyla Murphy in with pt and family.

## 2019-12-12 NOTE — Procedures (Signed)
Cannot do eeg today. Central line placement then getting tx to ICU

## 2019-12-12 DEATH — deceased

## 2021-01-28 IMAGING — DX DG CHEST 1V PORT
1 series · 1 of 1 positions shown · non-contrast
Comparison: Earlier radiograph dated 11/20/2019.

CLINICAL DATA: 64-year-old female status post intubation and
central line placement.

EXAM:
PORTABLE CHEST 1 VIEW

[chest ap]
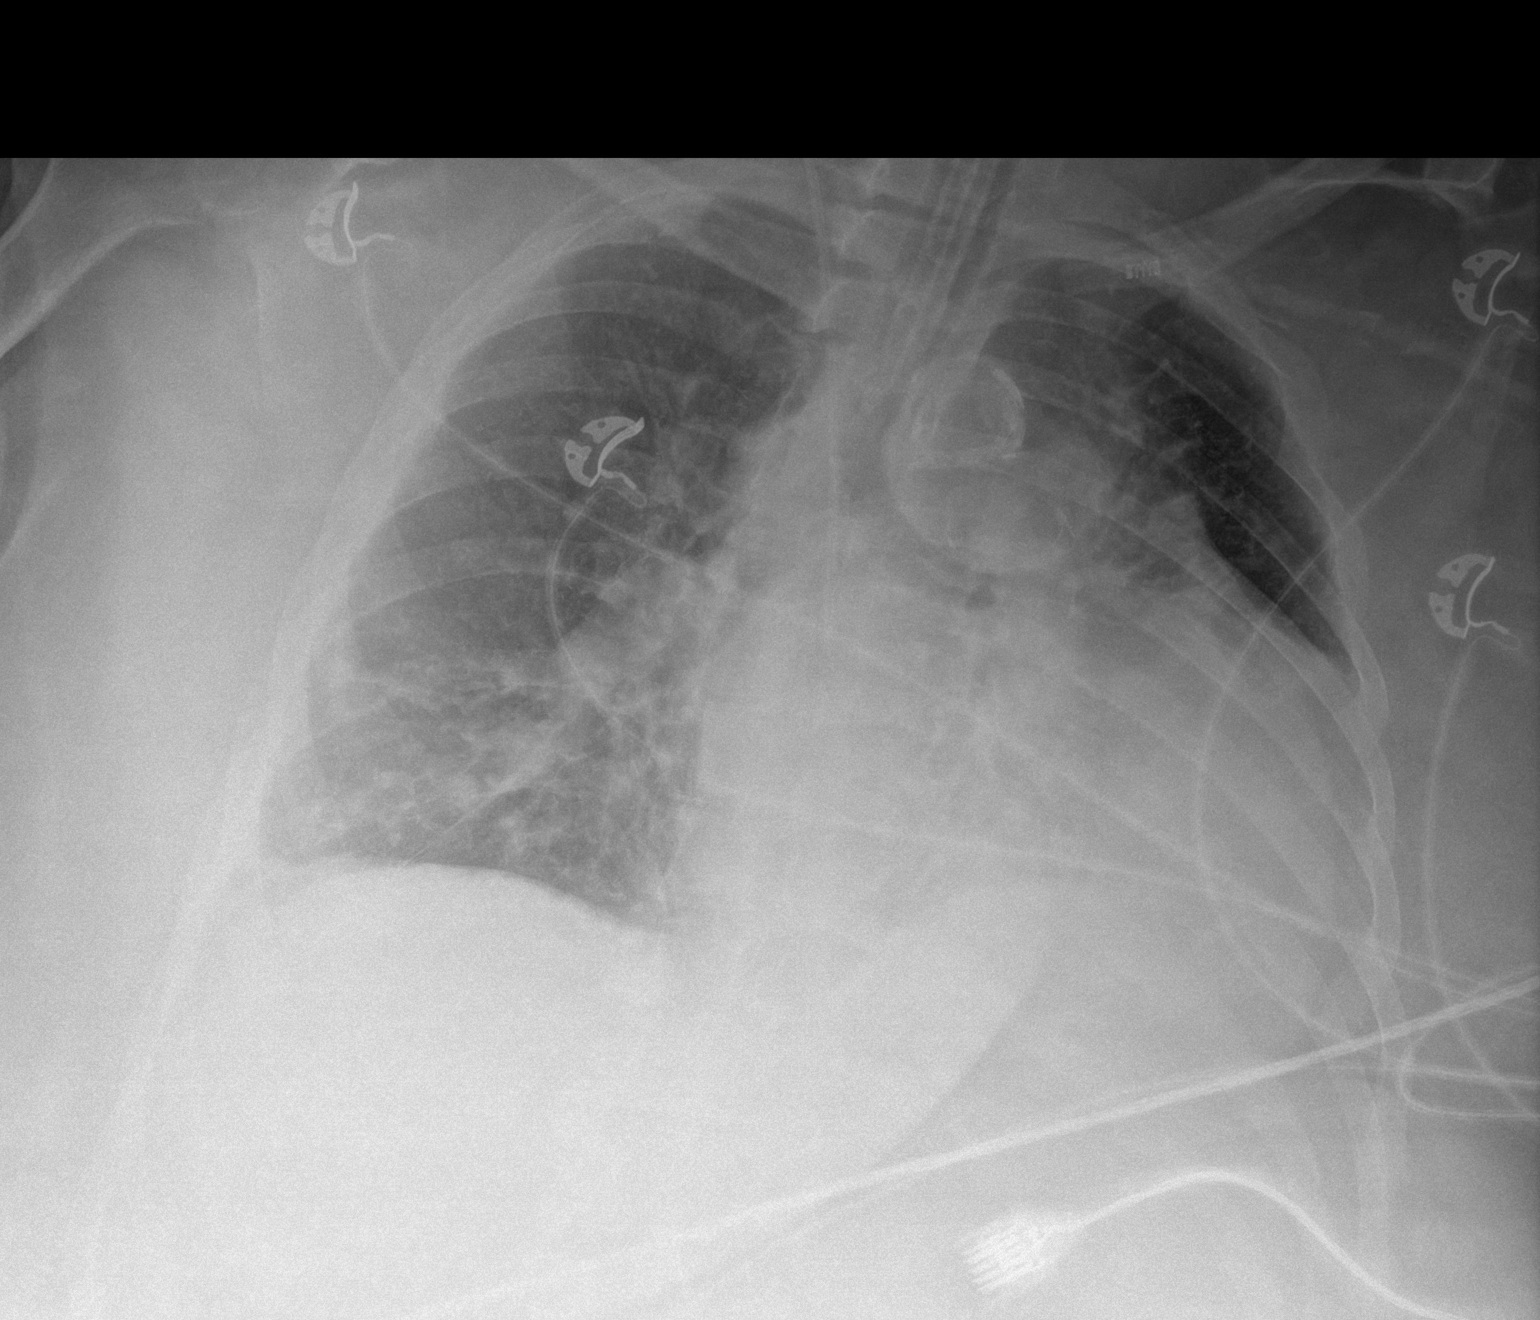

[1 of 1 positions shown; findings below may reference images not displayed]

FINDINGS: Endotracheal tube with tip approximately 3 cm above the carina.
Right IJ central venous line with tip over central SVC close to the
cavoatrial junction.

No significant interval change in bilateral lower lobe pulmonary
densities. No pneumothorax. Cardiomegaly as seen on the earlier
radiograph. Atherosclerotic calcification of the aorta.
IMPRESSION: 1. Interval placement of an endotracheal tube with tip above the
carina and a right IJ central venous line with tip over central SVC.
No pneumothorax.
2. No significant interval change in the appearance of the lungs or
cardiomediastinal silhouette.

## 2021-01-28 IMAGING — CT CT HEAD W/O CM
2 series · 15 of 37 positions shown, 18 images · non-contrast
Comparison: None.

CLINICAL DATA: Mental status change, unknown cause.  Seizure.

EXAM:
CT HEAD WITHOUT CONTRAST
TECHNIQUE: Contiguous axial images were obtained from the base of the skull
through the vertex without intravenous contrast.

[Series 3: head wo · axial · 0.42mm/px · z∈[-108,+17]mm · 12 of 30 slices shown, 15 images]
[im 3/30  brain]
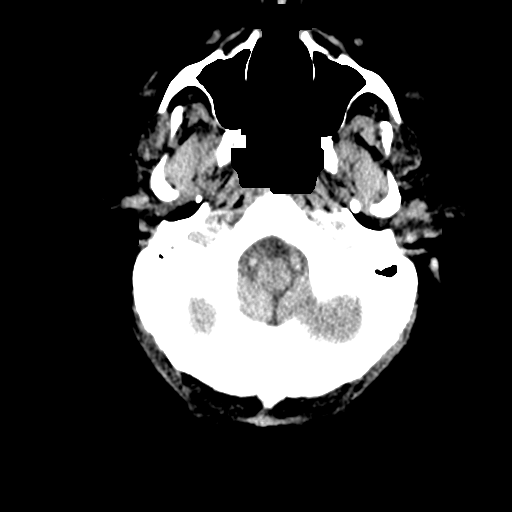
[im 3/30  bone]
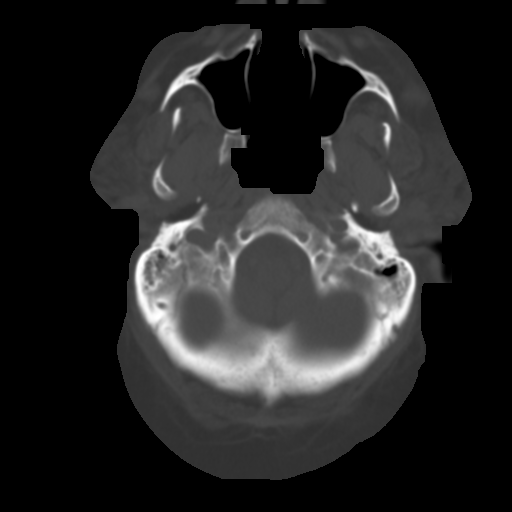
[im 5/30  brain]
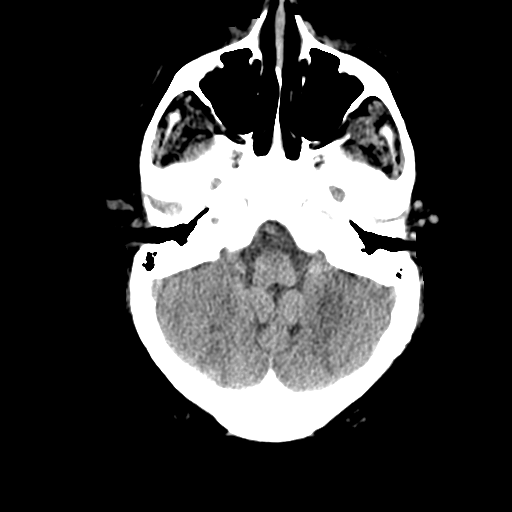
[im 7/30  brain]
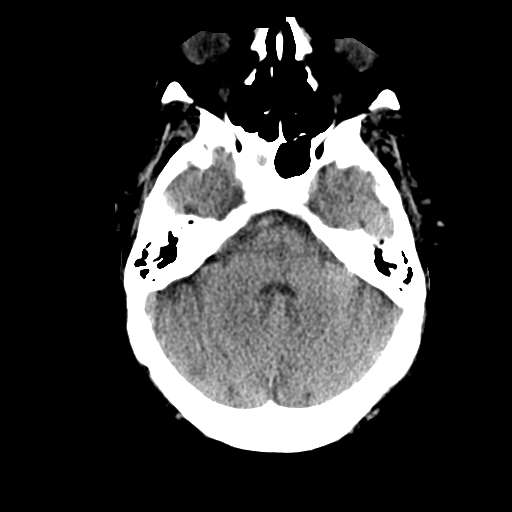
[im 10/30  brain]
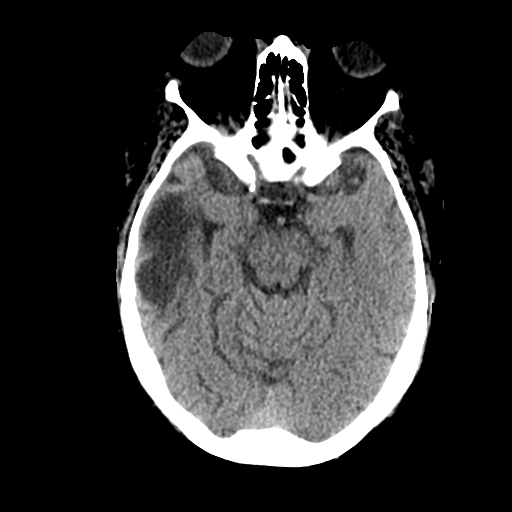
[im 12/30  brain]
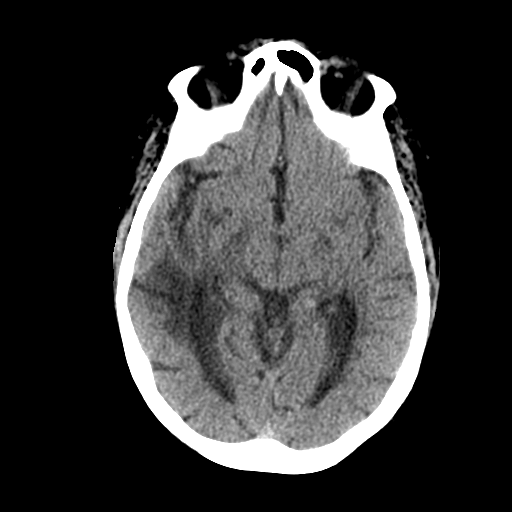
[im 12/30  bone]
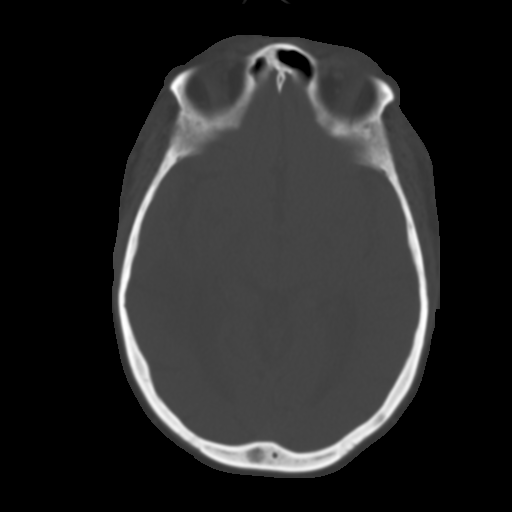
[im 14/30  brain]
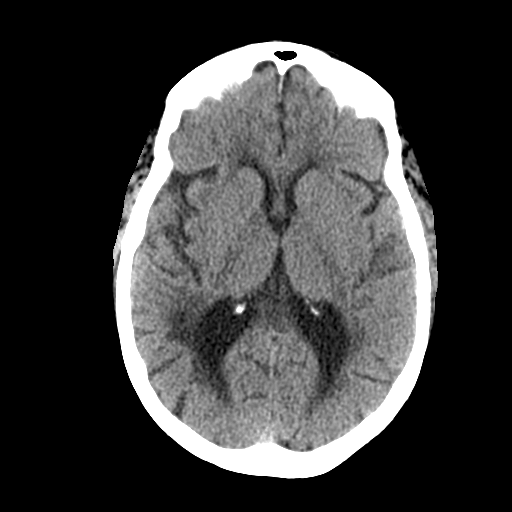
[im 17/30  brain]
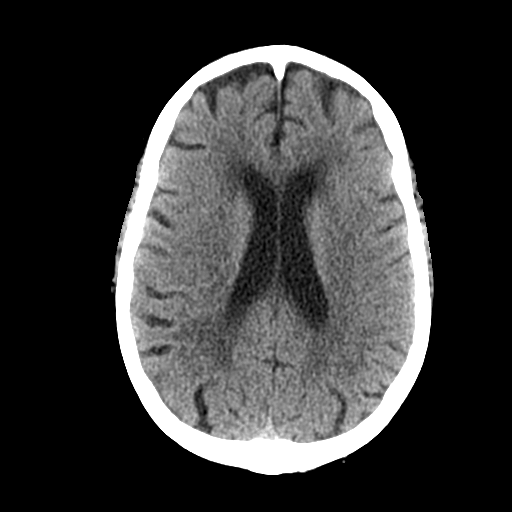
[im 19/30  brain]
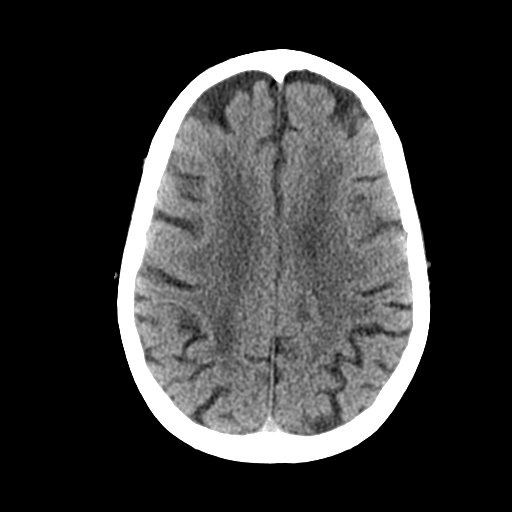
[im 21/30  brain]
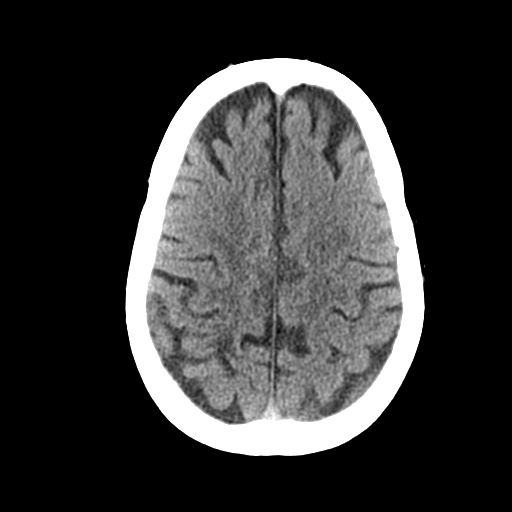
[im 21/30  bone]
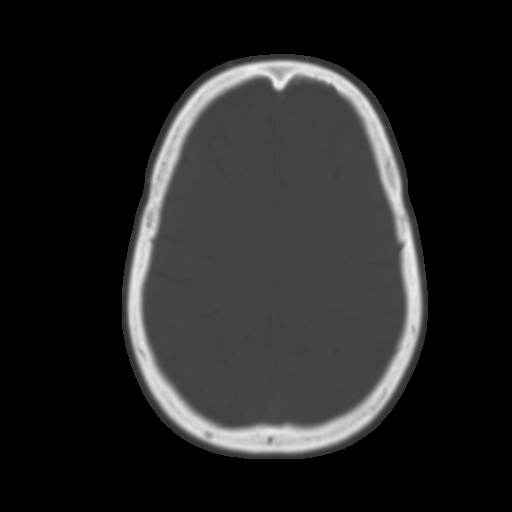
[im 24/30  brain]
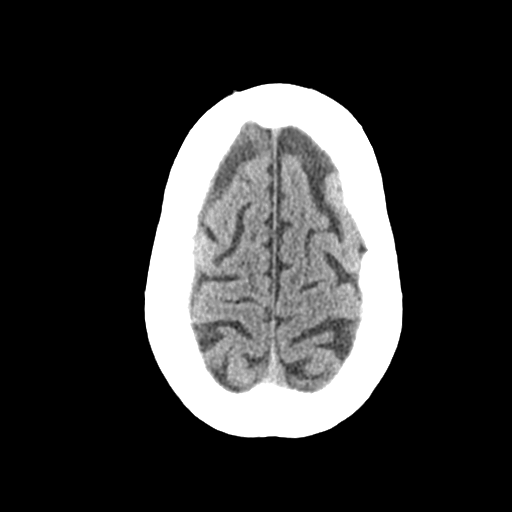
[im 26/30  brain]
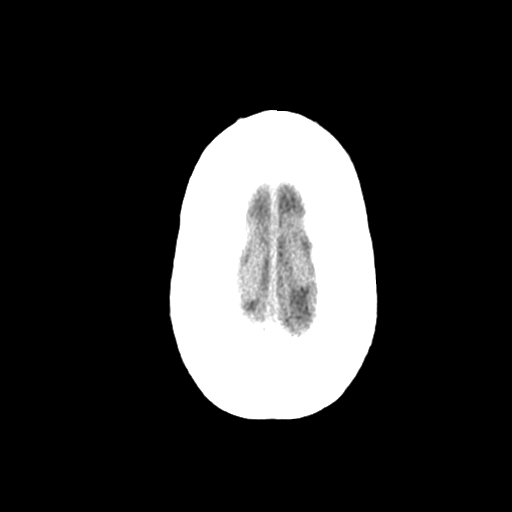
[im 28/30  brain]
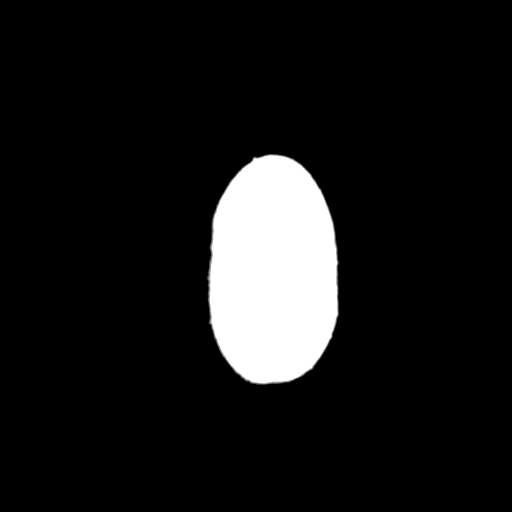

[Series 5: sagittal soft tissue · sagittal · 0.29mm/px · 3 of 49 slices shown]
[im 17/49  brain]
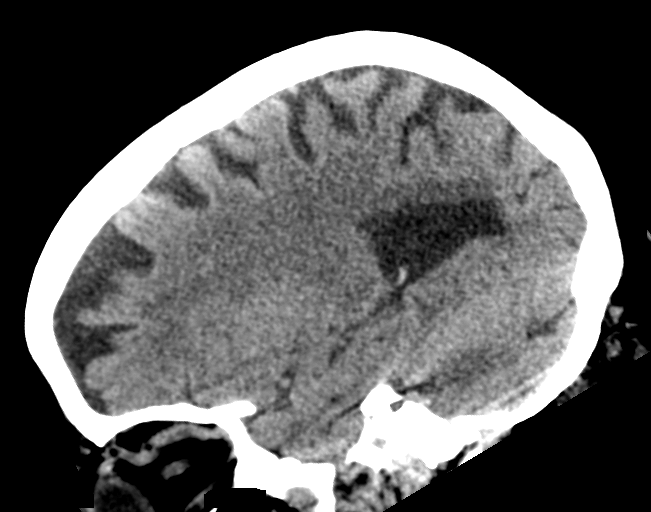
[im 25/49  brain]
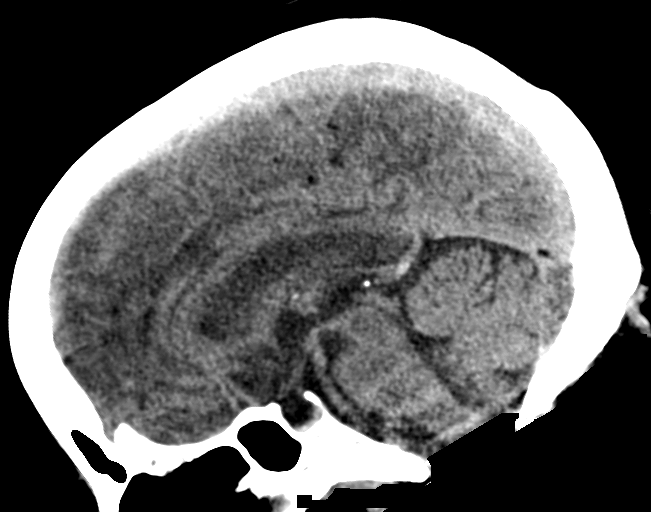
[im 33/49  brain]
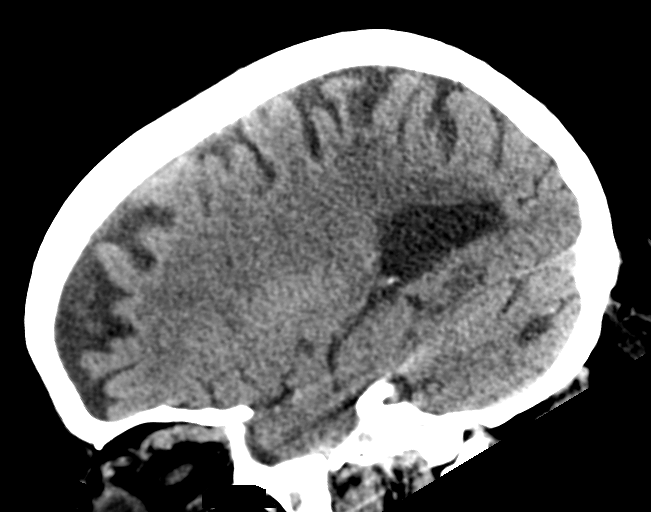

[15 of 37 positions shown; findings below may reference images not displayed]

FINDINGS: Brain: Diffuse area of hypoattenuation is present in the right
middle temporal gyrus and right parietal lobe without significant ex
vacuo dilation. Additional focal area of cortical hypoattenuation is
present in high right parietal lobe on image 22 of series 3. Atrophy
and diffuse white matter changes are present otherwise. Basal
ganglia and insular ribbon are normal. Acute infarct is present
scratched at no acute hemorrhage is present. Areas of
hypoattenuation are present the cerebellum bilaterally, right
greater than left. The ventricles are of normal size. No significant
extraaxial fluid collection is present.

Vascular: Atherosclerotic changes are present within the cavernous
internal carotid arteries. No hyperdense vessel is present.

Skull: Calvarium is intact. No focal lytic or blastic lesions are
present. No significant extracranial soft tissue lesion is present.

Sinuses/Orbits: Chronic opacification the right sphenoid sinus is
noted. The paranasal sinuses and mastoid air cells are otherwise
clear. Bilateral lens replacements are noted. Globes and orbits are
otherwise unremarkable.
IMPRESSION: 1. Diffuse area of hypoattenuation in the right middle temporal
gyrus and right parietal lobe without significant ex vacuo dilation.
This is consistent with an age indeterminate infarct.
2. Additional focal areas of cortical hypoattenuation in the high
right parietal lobe and bilateral cerebellum are also concerning for
infarcts.
3. Atrophy and diffuse white matter changes likely reflect the
sequela of chronic microvascular ischemia.
4. Chronic right sphenoid sinus disease.

## 2021-01-28 IMAGING — DX DG CHEST 1V PORT
2 series · 2 of 2 positions shown · non-contrast
Comparison: One-view chest x-ray 11/19/2019.

CLINICAL DATA: Acute hypoxic respiratory failure.

EXAM:
PORTABLE CHEST 1 VIEW

[chest ap (1 of 2)]
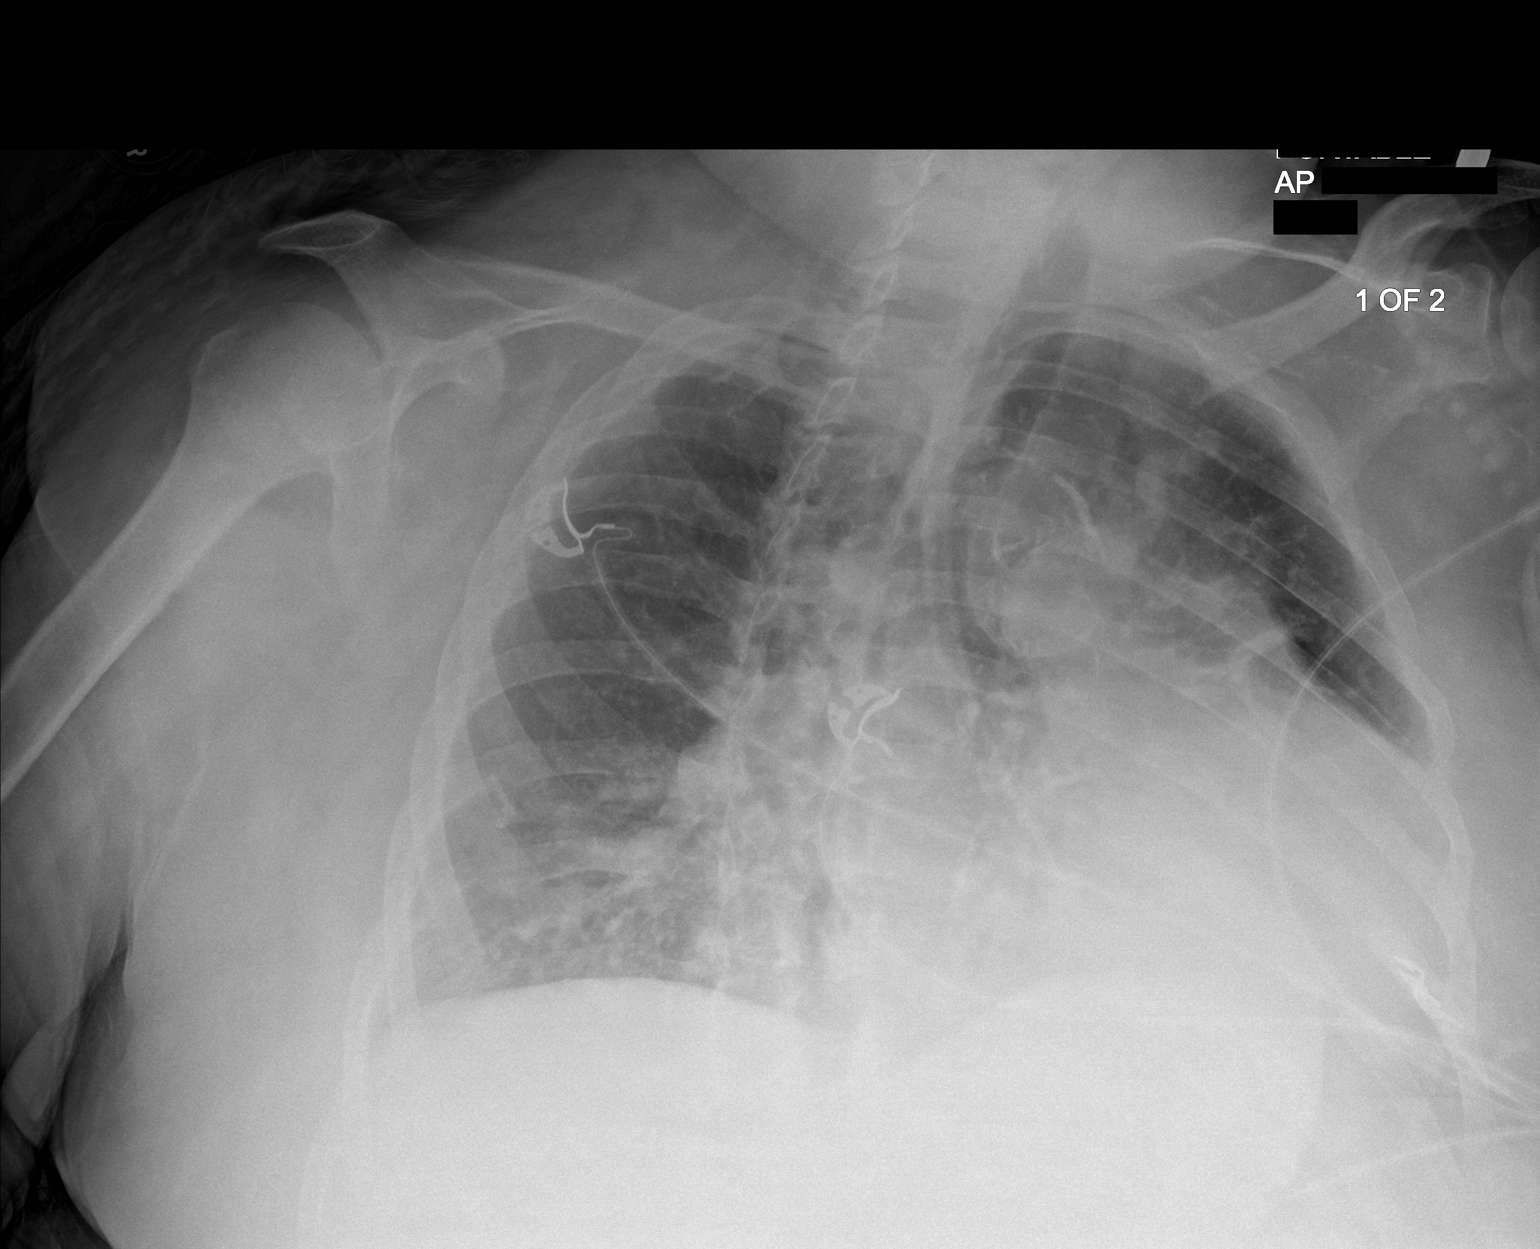

[chest ap (2 of 2)]
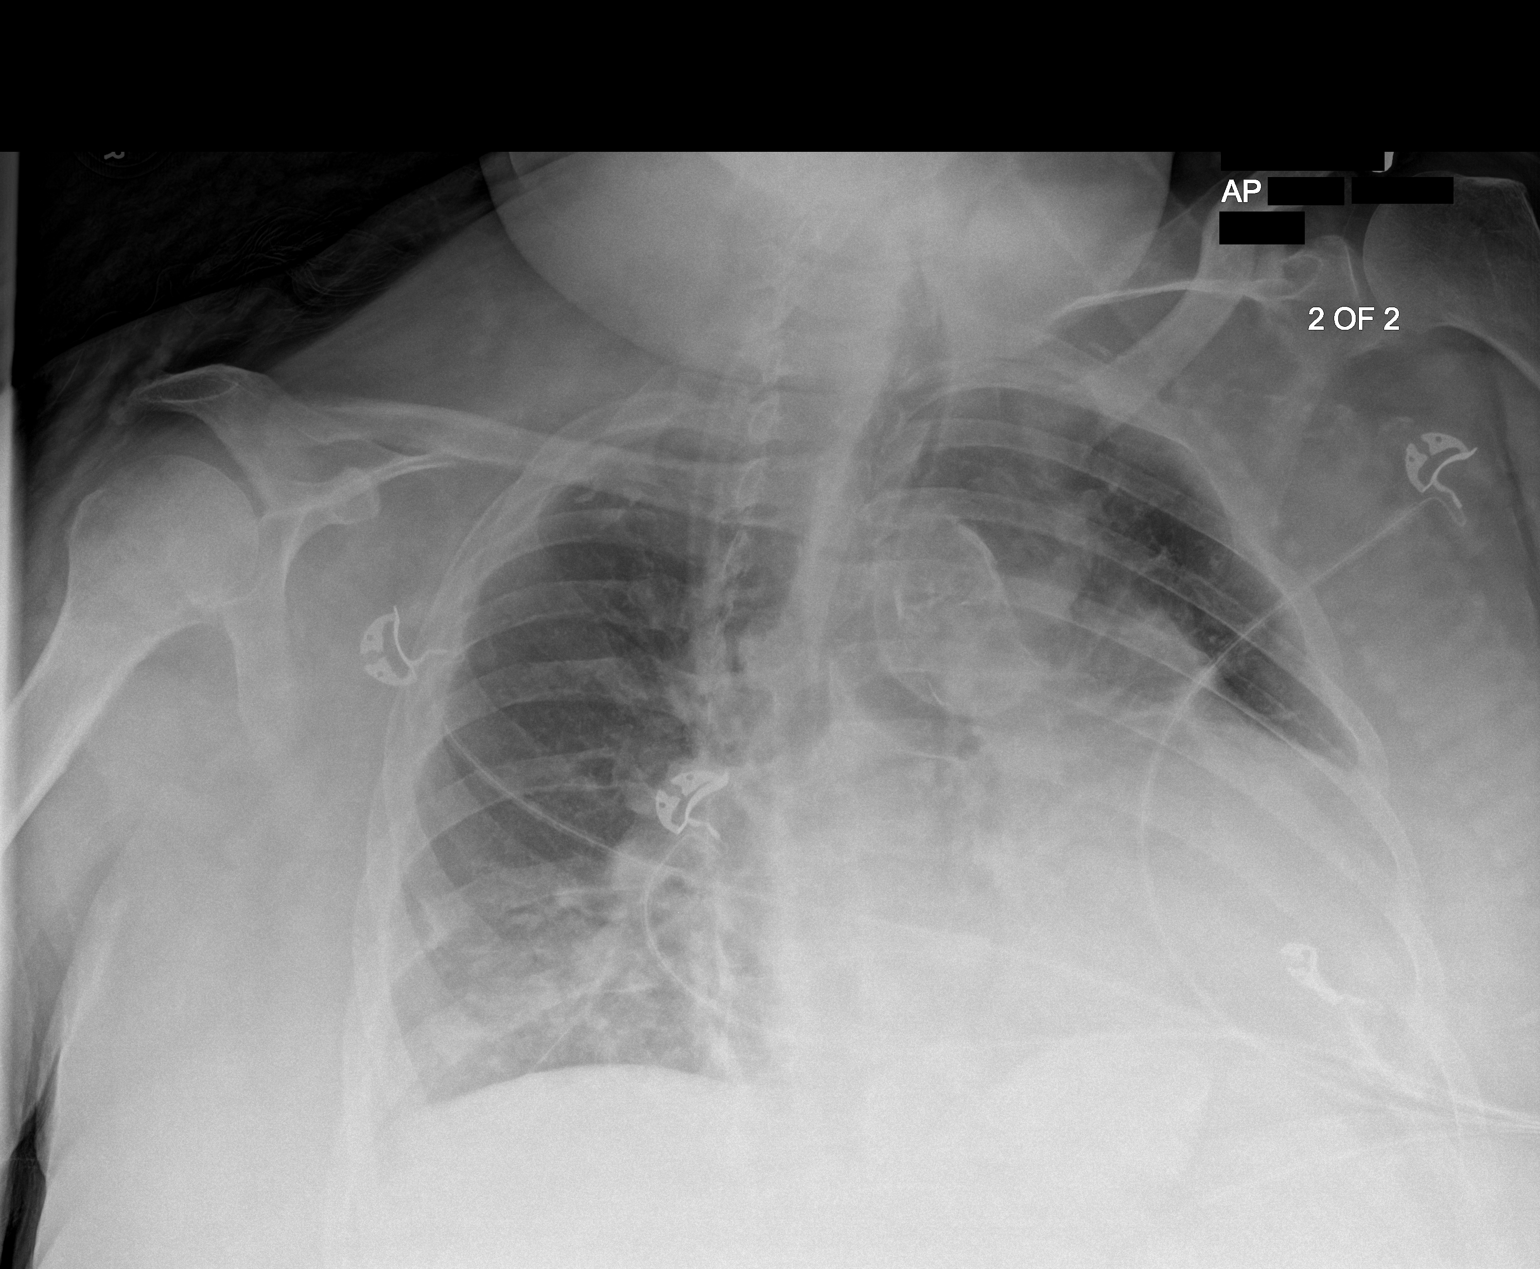

[2 of 2 positions shown; findings below may reference images not displayed]

FINDINGS: Heart is enlarged. Patient is slightly rotated left despite multiple
attempts. Atherosclerotic changes are present at the arch. Bibasilar
airspace opacities have increased slightly. Pulmonary vascular
congestion is noted.
IMPRESSION: 1. Cardiomegaly with increasing pulmonary vascular congestion.
2. Increasing bibasilar airspace disease. While this likely reflects
atelectasis, infection is also considered.
